# Patient Record
Sex: Female | Born: 1975 | Race: Black or African American | Hispanic: No | Marital: Married | Smoking: Never smoker
Health system: Southern US, Community
[De-identification: ages and names within clinical notes are randomized; demographics above are authoritative.]

## PROBLEM LIST (undated history)

## (undated) DIAGNOSIS — Z789 Other specified health status: Secondary | ICD-10-CM

## (undated) HISTORY — DX: Other specified health status: Z78.9

---

## 2014-12-07 ENCOUNTER — Emergency Department (HOSPITAL_COMMUNITY): Payer: Medicaid Other

## 2014-12-07 ENCOUNTER — Encounter (HOSPITAL_COMMUNITY): Payer: Self-pay | Admitting: Emergency Medicine

## 2014-12-07 ENCOUNTER — Emergency Department (HOSPITAL_COMMUNITY)
Admission: EM | Admit: 2014-12-07 | Discharge: 2014-12-07 | Disposition: A | Discharge status: Home / Self Care | Payer: Medicaid Other | Attending: Emergency Medicine | Admitting: Emergency Medicine

## 2014-12-07 DIAGNOSIS — Z3201 Encounter for pregnancy test, result positive: Secondary | ICD-10-CM | POA: Diagnosis not present

## 2014-12-07 DIAGNOSIS — Z331 Pregnant state, incidental: Secondary | ICD-10-CM | POA: Insufficient documentation

## 2014-12-07 DIAGNOSIS — R42 Dizziness and giddiness: Secondary | ICD-10-CM | POA: Insufficient documentation

## 2014-12-07 DIAGNOSIS — Z3490 Encounter for supervision of normal pregnancy, unspecified, unspecified trimester: Secondary | ICD-10-CM

## 2014-12-07 LAB — I-STAT CHEM 8, ED
BUN: 4 mg/dL — ABNORMAL LOW (ref 6–20)
Calcium, Ion: 1.19 mmol/L (ref 1.12–1.23)
Chloride: 102 mmol/L (ref 101–111)
Creatinine, Ser: 0.4 mg/dL — ABNORMAL LOW (ref 0.44–1.00)
GLUCOSE: 65 mg/dL (ref 65–99)
HCT: 39 % (ref 36.0–46.0)
Hemoglobin: 13.3 g/dL (ref 12.0–15.0)
Potassium: 3.4 mmol/L — ABNORMAL LOW (ref 3.5–5.1)
Sodium: 139 mmol/L (ref 135–145)
TCO2: 22 mmol/L (ref 0–100)

## 2014-12-07 LAB — HCG, QUANTITATIVE, PREGNANCY: HCG, BETA CHAIN, QUANT, S: 115375 m[IU]/mL — AB (ref <5)

## 2014-12-07 LAB — POC URINE PREG, ED: Preg Test, Ur: POSITIVE — AB

## 2014-12-07 MED ORDER — PRENATAL COMPLETE 14-0.4 MG PO TABS: 1.0000 | ORAL_TABLET | Freq: Every day | ORAL | Status: DC

## 2014-12-07 MED ORDER — ONDANSETRON 4 MG PO TBDP
4.0000 mg | ORAL_TABLET | Freq: Once | ORAL | Status: AC | PRN
  Administered 2014-12-07: 4 mg via ORAL

## 2014-12-07 MED ORDER — ONDANSETRON 4 MG PO TBDP
ORAL_TABLET | ORAL | Status: AC
  Filled 2014-12-07: qty 1

## 2014-12-07 NOTE — Discharge Instructions (Signed)
First Trimester of Pregnancy The first trimester of pregnancy is from week 1 until the end of week 12 (months 1 through 3). A week after a sperm fertilizes an egg, the egg will implant on the wall of the uterus. This embryo will begin to develop into a baby. Genes from you and your partner are forming the baby. The female genes determine whether the baby is a boy or a girl. At 6-8 weeks, the eyes and face are formed, and the heartbeat can be seen on ultrasound. At the end of 12 weeks, all the baby's organs are formed.  Now that you are pregnant, you will want to do everything you can to have a healthy baby. Two of the most important things are to get good prenatal care and to follow your health care provider's instructions. Prenatal care is all the medical care you receive before the baby's birth. This care will help prevent, find, and treat any problems during the pregnancy and childbirth. BODY CHANGES Your body goes through many changes during pregnancy. The changes vary from woman to woman.   You may gain or lose a couple of pounds at first.  You may feel sick to your stomach (nauseous) and throw up (vomit). If the vomiting is uncontrollable, call your health care provider.  You may tire easily.  You may develop headaches that can be relieved by medicines approved by your health care provider.  You may urinate more often. Painful urination may mean you have a bladder infection.  You may develop heartburn as a result of your pregnancy.  You may develop constipation because certain hormones are causing the muscles that push waste through your intestines to slow down.  You may develop hemorrhoids or swollen, bulging veins (varicose veins).  Your breasts may begin to grow larger and become tender. Your nipples may stick out more, and the tissue that surrounds them (areola) may become darker.  Your gums may bleed and may be sensitive to brushing and flossing.  Dark spots or blotches (chloasma,  mask of pregnancy) may develop on your face. This will likely fade after the baby is born.  Your menstrual periods will stop.  You may have a loss of appetite.  You may develop cravings for certain kinds of food.  You may have changes in your emotions from day to day, such as being excited to be pregnant or being concerned that something may go wrong with the pregnancy and baby.  You may have more vivid and strange dreams.  You may have changes in your hair. These can include thickening of your hair, rapid growth, and changes in texture. Some women also have hair loss during or after pregnancy, or hair that feels dry or thin. Your hair will most likely return to normal after your baby is born. WHAT TO EXPECT AT YOUR PRENATAL VISITS During a routine prenatal visit:  You will be weighed to make sure you and the baby are growing normally.  Your blood pressure will be taken.  Your abdomen will be measured to track your baby's growth.  The fetal heartbeat will be listened to starting around week 10 or 12 of your pregnancy.  Test results from any previous visits will be discussed. Your health care provider may ask you:  How you are feeling.  If you are feeling the baby move.  If you have had any abnormal symptoms, such as leaking fluid, bleeding, severe headaches, or abdominal cramping.  If you have any questions. Other tests   that may be performed during your first trimester include:  Blood tests to find your blood type and to check for the presence of any previous infections. They will also be used to check for low iron levels (anemia) and Rh antibodies. Later in the pregnancy, blood tests for diabetes will be done along with other tests if problems develop.  Urine tests to check for infections, diabetes, or protein in the urine.  An ultrasound to confirm the proper growth and development of the baby.  An amniocentesis to check for possible genetic problems.  Fetal screens for  spina bifida and Down syndrome.  You may need other tests to make sure you and the baby are doing well. HOME CARE INSTRUCTIONS  Medicines  Follow your health care provider's instructions regarding medicine use. Specific medicines may be either safe or unsafe to take during pregnancy.  Take your prenatal vitamins as directed.  If you develop constipation, try taking a stool softener if your health care provider approves. Diet  Eat regular, well-balanced meals. Choose a variety of foods, such as meat or vegetable-based protein, fish, milk and low-fat dairy products, vegetables, fruits, and whole grain breads and cereals. Your health care provider will help you determine the amount of weight gain that is right for you.  Avoid raw meat and uncooked cheese. These carry germs that can cause birth defects in the baby.  Eating four or five small meals rather than three large meals a day may help relieve nausea and vomiting. If you start to feel nauseous, eating a few soda crackers can be helpful. Drinking liquids between meals instead of during meals also seems to help nausea and vomiting.  If you develop constipation, eat more high-fiber foods, such as fresh vegetables or fruit and whole grains. Drink enough fluids to keep your urine clear or pale yellow. Activity and Exercise  Exercise only as directed by your health care provider. Exercising will help you:  Control your weight.  Stay in shape.  Be prepared for labor and delivery.  Experiencing pain or cramping in the lower abdomen or low back is a good sign that you should stop exercising. Check with your health care provider before continuing normal exercises.  Try to avoid standing for long periods of time. Move your legs often if you must stand in one place for a long time.  Avoid heavy lifting.  Wear low-heeled shoes, and practice good posture.  You may continue to have sex unless your health care provider directs you  otherwise. Relief of Pain or Discomfort  Wear a good support bra for breast tenderness.   Take warm sitz baths to soothe any pain or discomfort caused by hemorrhoids. Use hemorrhoid cream if your health care provider approves.   Rest with your legs elevated if you have leg cramps or low back pain.  If you develop varicose veins in your legs, wear support hose. Elevate your feet for 15 minutes, 3-4 times a day. Limit salt in your diet. Prenatal Care  Schedule your prenatal visits by the twelfth week of pregnancy. They are usually scheduled monthly at first, then more often in the last 2 months before delivery.  Write down your questions. Take them to your prenatal visits.  Keep all your prenatal visits as directed by your health care provider. Safety  Wear your seat belt at all times when driving.  Make a list of emergency phone numbers, including numbers for family, friends, the hospital, and police and fire departments. General Tips    Ask your health care provider for a referral to a local prenatal education class. Begin classes no later than at the beginning of month 6 of your pregnancy.  Ask for help if you have counseling or nutritional needs during pregnancy. Your health care provider can offer advice or refer you to specialists for help with various needs.  Do not use hot tubs, steam rooms, or saunas.  Do not douche or use tampons or scented sanitary pads.  Do not cross your legs for long periods of time.  Avoid cat litter boxes and soil used by cats. These carry germs that can cause birth defects in the baby and possibly loss of the fetus by miscarriage or stillbirth.  Avoid all smoking, herbs, alcohol, and medicines not prescribed by your health care provider. Chemicals in these affect the formation and growth of the baby.  Schedule a dentist appointment. At home, brush your teeth with a soft toothbrush and be gentle when you floss. SEEK MEDICAL CARE IF:   You have  dizziness.  You have mild pelvic cramps, pelvic pressure, or nagging pain in the abdominal area.  You have persistent nausea, vomiting, or diarrhea.  You have a bad smelling vaginal discharge.  You have pain with urination.  You notice increased swelling in your face, hands, legs, or ankles. SEEK IMMEDIATE MEDICAL CARE IF:   You have a fever.  You are leaking fluid from your vagina.  You have spotting or bleeding from your vagina.  You have severe abdominal cramping or pain.  You have rapid weight gain or loss.  You vomit blood or material that looks like coffee grounds.  You are exposed to [place] measles and have never had them.  You are exposed to fifth disease or chickenpox.  You develop a severe headache.  You have shortness of breath.  You have any kind of trauma, such as from a fall or a car accident. Document Released: 04/10/2001 Document Revised: 08/31/2013 Document Reviewed: 02/24/2013 [employer] Patient Information 2015 [employer]. This information is not intended to replace advice given to you by your health care provider. Make sure you discuss any questions you have with your health care provider.  

## 2014-12-07 NOTE — ED Notes (Signed)
Pt sent home with all belongings

## 2014-12-07 NOTE — ED Provider Notes (Signed)
CSN: [id number]     Arrival date & time 12/07/14  1238 History   First MD Initiated Contact with Patient 12/07/14 1411     Chief Complaint  Patient presents with  . Dizziness     (Consider location/radiation/quality/duration/timing/severity/associated sxs/prior Treatment) Patient is a 39 y.o. female presenting with dizziness. The history is provided by the patient and the spouse. The history is limited by a language barrier. Language interpreter used: husband speaks english.  Dizziness Quality:  Lightheadedness Severity:  Moderate Onset quality:  Gradual Duration:  5 days Timing:  Intermittent Progression:  Unchanged Chronicity:  New Context: bending over and standing up   Associated symptoms: no vomiting   Associated symptoms comment:  Nausea for the last 3 days.  LMP was 10/26/2014.  No vaginal bleeding, dysuria or abd pain.  No vomiting Risk factors: no multiple medications and no new medications     History reviewed. No pertinent past medical history. No past surgical history on file. History reviewed. No pertinent family history. History  Substance Use Topics  . Smoking status: Not on file  . Smokeless tobacco: Not on file  . Alcohol Use: Not on file   OB History    No data available     Review of Systems  Gastrointestinal: Negative for vomiting.  Neurological: Positive for dizziness.  All other systems reviewed and are negative.     Allergies  Review of patient's allergies indicates no known allergies.  Home Medications   Prior to Admission medications   Not on File   BP 109/60 mmHg  Pulse 69  Temp(Src) 98.4 F (36.9 C) (Oral)  Resp 16  SpO2 100% Physical Exam  Constitutional: She is oriented to person, place, and time. She appears well-developed and well-nourished. No distress.  HENT:  Head: Normocephalic and atraumatic.  Mouth/Throat: Oropharynx is clear and moist.  Eyes: Conjunctivae and EOM are normal. Pupils are equal, round, and reactive to  light.  Neck: Normal range of motion. Neck supple.  Cardiovascular: Normal rate, regular rhythm and intact distal pulses.   No murmur heard. Pulmonary/Chest: Effort normal and breath sounds normal. No respiratory distress. She has no wheezes. She has no rales.  Abdominal: Soft. She exhibits no distension. There is no tenderness. There is no rebound and no guarding.  Musculoskeletal: Normal range of motion. She exhibits no edema or tenderness.  Neurological: She is alert and oriented to person, place, and time.  Skin: Skin is warm and dry. No rash noted. No erythema.  Psychiatric: She has a normal mood and affect. Her behavior is normal.  Nursing note and vitals reviewed.   ED Course  Procedures (including critical care time) Labs Review Labs Reviewed  POC URINE PREG, ED - Abnormal; Notable for the following:    Preg Test, Ur POSITIVE (*)    All other components within normal limits  HCG, QUANTITATIVE, PREGNANCY    Imaging Review No results found.   EKG Interpretation None      MDM   Final diagnoses:  None    Patient is a 39 year old G4P3 who is otherwise healthy female who presents with feeling dizzy upon standing for the last 5 days and nausea. No diarrhea, no fever, no vaginal bleeding. Last menstrual period was at the end of June.  Vital signs are within normal limits. She denies any abdominal pain and on exam has a normal exam. Bedside ultrasound abdominally shows a gestational sac measuring 6-7 weeks but no evidence of yolk sac or fetus. Pregnancy test  is positive.  HCG and formal ultrasound transvaginally pending  Pt checked out toDr. Herbie Baltimore, MD 12/13/14 [phone]

## 2014-12-07 NOTE — ED Notes (Signed)
Dizziness x5 days. Emesis x3 days. NO diarrhea. Travelled from [place] 1 week ago. NO fever. NAD. NO other sick contacts at home

## 2014-12-07 NOTE — ED Notes (Signed)
Patient transported to Ultrasound 

## 2014-12-07 NOTE — ED Provider Notes (Signed)
Care assumed from Dr. Anitra Lauth.  Patient with lightheadedness and +HCG.  [place] pending.  Ultrasound shows intrauterine gestational pregnancy at 6 weeks. No complicating features. Start prenatal vitamins.  Followup with OB. Return precautions discussed.  Glynn Octave, MD 12/07/14 1750

## 2014-12-07 NOTE — ED Notes (Signed)
MD at bedside to perform bedside US; more testing ordered. Pt does not speak English- Arabic. Husband at bedside does speak [place].

## 2014-12-31 ENCOUNTER — Ambulatory Visit (INDEPENDENT_AMBULATORY_CARE_PROVIDER_SITE_OTHER): Payer: Medicaid Other | Admitting: Physician Assistant

## 2014-12-31 ENCOUNTER — Other Ambulatory Visit (HOSPITAL_COMMUNITY)
Admission: RE | Admit: 2014-12-31 | Discharge: 2014-12-31 | Disposition: A | Discharge status: Home / Self Care | Payer: Medicaid Other | Source: Ambulatory Visit | Attending: Physician Assistant | Admitting: Physician Assistant

## 2014-12-31 ENCOUNTER — Encounter: Payer: Self-pay | Admitting: Student

## 2014-12-31 ENCOUNTER — Other Ambulatory Visit: Payer: Self-pay | Admitting: Physician Assistant

## 2014-12-31 VITALS — BP 102/62 | HR 71 | Temp 98.2°F | Ht 62.0 in | Wt 106.1 lb

## 2014-12-31 DIAGNOSIS — Z1151 Encounter for screening for human papillomavirus (HPV): Secondary | ICD-10-CM | POA: Insufficient documentation

## 2014-12-31 DIAGNOSIS — Z01419 Encounter for gynecological examination (general) (routine) without abnormal findings: Secondary | ICD-10-CM | POA: Diagnosis present

## 2014-12-31 DIAGNOSIS — O09521 Supervision of elderly multigravida, first trimester: Secondary | ICD-10-CM | POA: Diagnosis present

## 2014-12-31 DIAGNOSIS — Z3A19 19 weeks gestation of pregnancy: Secondary | ICD-10-CM

## 2014-12-31 DIAGNOSIS — Z113 Encounter for screening for infections with a predominantly sexual mode of transmission: Secondary | ICD-10-CM | POA: Insufficient documentation

## 2014-12-31 DIAGNOSIS — Z3689 Encounter for other specified antenatal screening: Secondary | ICD-10-CM

## 2014-12-31 DIAGNOSIS — Z98891 History of uterine scar from previous surgery: Secondary | ICD-10-CM | POA: Insufficient documentation

## 2014-12-31 DIAGNOSIS — Z3481 Encounter for supervision of other normal pregnancy, first trimester: Secondary | ICD-10-CM

## 2014-12-31 DIAGNOSIS — Z23 Encounter for immunization: Secondary | ICD-10-CM

## 2014-12-31 DIAGNOSIS — O09529 Supervision of elderly multigravida, unspecified trimester: Secondary | ICD-10-CM

## 2014-12-31 DIAGNOSIS — Z603 Acculturation difficulty: Secondary | ICD-10-CM | POA: Insufficient documentation

## 2014-12-31 DIAGNOSIS — Z118 Encounter for screening for other infectious and parasitic diseases: Secondary | ICD-10-CM | POA: Diagnosis not present

## 2014-12-31 DIAGNOSIS — O219 Vomiting of pregnancy, unspecified: Secondary | ICD-10-CM | POA: Diagnosis not present

## 2014-12-31 DIAGNOSIS — K117 Disturbances of salivary secretion: Secondary | ICD-10-CM

## 2014-12-31 DIAGNOSIS — Z3491 Encounter for supervision of normal pregnancy, unspecified, first trimester: Secondary | ICD-10-CM | POA: Insufficient documentation

## 2014-12-31 DIAGNOSIS — Z124 Encounter for screening for malignant neoplasm of cervix: Secondary | ICD-10-CM

## 2014-12-31 DIAGNOSIS — O09522 Supervision of elderly multigravida, second trimester: Secondary | ICD-10-CM

## 2014-12-31 DIAGNOSIS — Z789 Other specified health status: Secondary | ICD-10-CM | POA: Insufficient documentation

## 2014-12-31 LAB — POCT URINALYSIS DIP (DEVICE)
Bilirubin Urine: NEGATIVE
Glucose, UA: NEGATIVE mg/dL
Ketones, ur: NEGATIVE mg/dL
LEUKOCYTES UA: NEGATIVE
Nitrite: NEGATIVE
Protein, ur: NEGATIVE mg/dL
SPECIFIC GRAVITY, URINE: 1.02 (ref 1.005–1.030)
Urobilinogen, UA: 1 mg/dL (ref 0.0–1.0)
pH: 8.5 — ABNORMAL HIGH (ref 5.0–8.0)

## 2014-12-31 MED ORDER — PROMETHAZINE HCL 25 MG PO TABS: 25.0000 mg | ORAL_TABLET | Freq: Four times a day (QID) | ORAL | Status: DC | PRN

## 2014-12-31 MED ORDER — GLYCOPYRROLATE 1 MG PO TABS: 1.0000 mg | ORAL_TABLET | Freq: Three times a day (TID) | ORAL | Status: DC

## 2014-12-31 NOTE — Progress Notes (Signed)
Here for first prenatal visit.  Used [employer] [phone]. C/o pytalism, morning sickness, and dizzy at times. Given new prenatal information. C/o some bleeding in early pregnancy- none since 12/07/14 ER visit.

## 2014-12-31 NOTE — Progress Notes (Signed)
  Subjective:    Nancy Wright is being seen today for her first obstetrical visit. She is at [redacted]w[redacted]d gestation. Her obstetrical history is significant for previous c/section for breech presentation. Relationship with FOB: spouse. Patient does intend to breast feed. Pregnancy history fully reviewed.  Patient reports excessive saliva & spitting, and n/v.    Pt speak Arabic; language line used for visit.   Review of Systems:   Review of Systems  HEENT: spitting Cardio: no cheat pain or palpitations Resp: no SOB or cough GI: n/v. No abdominal pain, diarrhea, constipation GU: no vaginal bleeding, vaginal discharge, or vaginal irritation. Denies dysuria.   Objective:     BP 102/62 mmHg  Pulse 71  Temp(Src) 98.2 F (36.8 C)  Ht  (1.575 m)  Wt 106 lb 1.6 oz (48.127 kg)  BMI 19.40 kg/m2 Physical Exam  Exam General: alert & oriented. No apparent distress Neck: Trachea midline, thyroid normal. Full rom. Neck supple Chest: Lungs clear bilaterally. Heart rate RRR, no murmur. Breast exam: no lumps or masses, no nipple discharge Abdomen: bowel sounds present x 4. No distension or tenderness.  Genital: Normal external genitalia. Cervix: no friability, no discharge. Bimanual exam: cervix closed, uterus appropriate size for gestation, no ovarian masses or tenderness.    Assessment:    Pregnancy: [id number] Patient Active Problem List   Diagnosis Date Noted  . High-risk pregnancy, multigravida of advanced maternal age, antepartum 12/31/2014       Plan:     Initial labs drawn. Prenatal vitamins. Flu vaccine given.  Rx for Robinul & promethazine.  Problem list reviewed and updated. Role of ultrasound in pregnancy discussed; fetal survey: ordered. Follow up in 4 weeks.   Judeth Horn, NP  12/31/2014

## 2014-12-31 NOTE — Patient Instructions (Signed)
First Trimester of Pregnancy The first trimester of pregnancy is from week 1 until the end of week 12 (months 1 through 3). During this time, your baby will begin to develop inside you. At 6-8 weeks, the eyes and face are formed, and the heartbeat can be seen on ultrasound. At the end of 12 weeks, all the baby's organs are formed. Prenatal care is all the medical care you receive before the birth of your baby. Make sure you get good prenatal care and follow all of your doctor's instructions. HOME CARE  Medicines  Take medicine only as told by your doctor. Some medicines are safe and some are not during pregnancy.  Take your prenatal vitamins as told by your doctor.  Take medicine that helps you poop (stool softener) as needed if your doctor says it is okay. Diet  Eat regular, healthy meals.  Your doctor will tell you the amount of weight gain that is right for you.  Avoid raw meat and uncooked cheese.  If you feel sick to your stomach (nauseous) or throw up (vomit):  Eat 4 or 5 small meals a day instead of 3 large meals.  Try eating a few soda crackers.  Drink liquids between meals instead of during meals.  If you have a hard time pooping (constipation):  Eat high-fiber foods like fresh vegetables, fruit, and whole grains.  Drink enough fluids to keep your pee (urine) clear or pale yellow. Activity and Exercise  Exercise only as told by your doctor. Stop exercising if you have cramps or pain in your lower belly (abdomen) or low back.  Try to avoid standing for long periods of time. Move your legs often if you must stand in one place for a long time.  Avoid heavy lifting.  Wear low-heeled shoes. Sit and stand up straight.  You can have sex unless your doctor tells you not to. Relief of Pain or Discomfort  Wear a good support bra if your breasts are sore.  Take warm water baths (sitz baths) to soothe pain or discomfort caused by hemorrhoids. Use hemorrhoid cream if your  doctor says it is okay.  Rest with your legs raised if you have leg cramps or low back pain.  Wear support hose if you have puffy, bulging veins (varicose veins) in your legs. Raise (elevate) your feet for 15 minutes, 3-4 times a day. Limit salt in your diet. Prenatal Care  Schedule your prenatal visits by the twelfth week of pregnancy.  Write down your questions. Take them to your prenatal visits.  Keep all your prenatal visits as told by your doctor. Safety  Wear your seat belt at all times when driving.  Make a list of emergency phone numbers. The list should include numbers for family, friends, the hospital, and police and fire departments. General Tips  Ask your doctor for a referral to a local prenatal class. Begin classes no later than at the start of month 6 of your pregnancy.  Ask for help if you need counseling or help with nutrition. Your doctor can give you advice or tell you where to go for help.  Do not use hot tubs, steam rooms, or saunas.  Do not douche or use tampons or scented sanitary pads.  Do not cross your legs for long periods of time.  Avoid litter boxes and soil used by cats.  Avoid all smoking, herbs, and alcohol. Avoid drugs not approved by your doctor.  Visit your dentist. At home, brush your teeth   with a soft toothbrush. Be gentle when you floss. GET HELP IF:  You are dizzy.  You have mild cramps or pressure in your lower belly.  You have a nagging pain in your belly area.  You continue to feel sick to your stomach, throw up, or have watery poop (diarrhea).  You have a bad smelling fluid coming from your vagina.  You have pain with peeing (urination).  You have increased puffiness (swelling) in your face, hands, legs, or ankles. GET HELP RIGHT AWAY IF:   You have a fever.  You are leaking fluid from your vagina.  You have spotting or bleeding from your vagina.  You have very bad belly cramping or pain.  You gain or lose weight  rapidly.  You throw up blood. It may look like coffee grounds.  You are around people who have [place] measles, fifth disease, or chickenpox.  You have a very bad headache.  You have shortness of breath.  You have any kind of trauma, such as from a fall or a car accident. Document Released: 10/03/2007 Document Revised: 08/31/2013 Document Reviewed: 02/24/2013 [facility] Patient Information 2015 [place], [place]. This information is not intended to replace advice given to you by your health care provider. Make sure you discuss any questions you have with your health care provider.   Morning Sickness Morning sickness is when you feel sick to your stomach (nauseous) during pregnancy. This nauseous feeling may or may not come with vomiting. It often occurs in the morning but can be a problem any time of day. Morning sickness is most common during the first trimester, but it may continue throughout pregnancy. While morning sickness is unpleasant, it is usually harmless unless you develop severe and continual vomiting (hyperemesis gravidarum). This condition requires more intense treatment.  CAUSES  The cause of morning sickness is not completely known but seems to be related to normal hormonal changes that occur in pregnancy. RISK FACTORS You are at greater risk if you:  Experienced nausea or vomiting before your pregnancy.  Had morning sickness during a previous pregnancy.  Are pregnant with more than one baby, such as twins. TREATMENT  Do not use any medicines (prescription, over-the-counter, or herbal) for morning sickness without first talking to your health care provider. Your health care provider may prescribe or recommend:  Vitamin B6 supplements.  Anti-nausea medicines.  The herbal medicine ginger. HOME CARE INSTRUCTIONS   Only take over-the-counter or prescription medicines as directed by your health care provider.  Taking multivitamins before getting pregnant can prevent or  decrease the severity of morning sickness in most women.  Eat a piece of dry toast or unsalted crackers before getting out of bed in the morning.  Eat five or six small meals a day.  Eat dry and bland foods (rice, baked potato). Foods high in carbohydrates are often helpful.  Do not drink liquids with your meals. Drink liquids between meals.  Avoid greasy, fatty, and spicy foods.  Get someone to cook for you if the smell of any food causes nausea and vomiting.  If you feel nauseous after taking prenatal vitamins, take the vitamins at night or with a snack.  Snack on protein foods (nuts, yogurt, cheese) between meals if you are hungry.  Eat unsweetened gelatins for desserts.  Wearing an acupressure wristband (worn for sea sickness) may be helpful.  Acupuncture may be helpful.  Do not smoke.  Get a humidifier to keep the air in your house free of odors.  Get plenty of fresh air. SEEK MEDICAL CARE IF:   Your home remedies are not working, and you need medicine.  You feel dizzy or lightheaded.  You are losing weight. SEEK IMMEDIATE MEDICAL CARE IF:   You have persistent and uncontrolled nausea and vomiting.  You pass out (faint). MAKE SURE YOU:  Understand these instructions.  Will watch your condition.  Will get help right away if you are not doing well or get worse. Document Released: 06/07/2006 Document Revised: 04/21/2013 Document Reviewed: 10/01/2012 [facility] Patient Information 2015 [place], [place]. This information is not intended to replace advice given to you by your health care provider. Make sure you discuss any questions you have with your health care provider.

## 2015-01-01 LAB — PRESCRIPTION MONITORING PROFILE (19 PANEL)
Amphetamine/Meth: NEGATIVE ng/mL
BUPRENORPHINE, URINE: NEGATIVE ng/mL
Barbiturate Screen, Urine: NEGATIVE ng/mL
Benzodiazepine Screen, Urine: NEGATIVE ng/mL
Cannabinoid Scrn, Ur: NEGATIVE ng/mL
Carisoprodol, Urine: NEGATIVE ng/mL
Cocaine Metabolites: NEGATIVE ng/mL
Creatinine, Urine: 162.37 mg/dL (ref >20.0)
ECSTASY: NEGATIVE ng/mL
FENTANYL URINE: NEGATIVE ng/mL
MEPERIDINE UR: NEGATIVE ng/mL
METHADONE SCREEN, URINE: NEGATIVE ng/mL
METHAQUALONE SCREEN (URINE): NEGATIVE ng/mL
Nitrites, Initial: NEGATIVE ug/mL
OXYCODONE SCRN UR: NEGATIVE ng/mL
Opiate Screen, Urine: NEGATIVE ng/mL
PROPOXYPHENE: NEGATIVE ng/mL
Phencyclidine, Ur: NEGATIVE ng/mL
Tapentadol, urine: NEGATIVE ng/mL
Tramadol Scrn, Ur: NEGATIVE ng/mL
Zolpidem, Urine: NEGATIVE ng/mL
pH, Initial: 7.9 pH (ref 4.5–8.9)

## 2015-01-01 LAB — CULTURE, OB URINE
COLONY COUNT: NO GROWTH
Organism ID, Bacteria: NO GROWTH

## 2015-01-04 LAB — PRENATAL PROFILE (SOLSTAS)
Antibody Screen: NEGATIVE
Basophils Absolute: 0 10*3/uL (ref 0.0–0.1)
Basophils Relative: 0 % (ref 0–1)
EOS PCT: 2 % (ref 0–5)
Eosinophils Absolute: 0.1 10*3/uL (ref 0.0–0.7)
HCT: 36.6 % (ref 36.0–46.0)
HIV: NONREACTIVE
Hemoglobin: 12.5 g/dL (ref 12.0–15.0)
Hepatitis B Surface Ag: NEGATIVE
LYMPHS ABS: 1.5 10*3/uL (ref 0.7–4.0)
Lymphocytes Relative: 33 % (ref 12–46)
MCH: 27.1 pg (ref 26.0–34.0)
MCHC: 34.2 g/dL (ref 30.0–36.0)
MCV: 79.4 fL (ref 78.0–100.0)
MPV: 10.5 fL (ref 8.6–12.4)
Monocytes Absolute: 0.4 10*3/uL (ref 0.1–1.0)
Monocytes Relative: 9 % (ref 3–12)
Neutro Abs: 2.5 10*3/uL (ref 1.7–7.7)
Neutrophils Relative %: 56 % (ref 43–77)
PLATELETS: 254 10*3/uL (ref 150–400)
RBC: 4.61 MIL/uL (ref 3.87–5.11)
RDW: 14.3 % (ref 11.5–15.5)
RH TYPE: POSITIVE
Rubella: 2.67 Index — ABNORMAL HIGH (ref <0.90)
WBC: 4.5 10*3/uL (ref 4.0–10.5)

## 2015-01-05 LAB — HEMOGLOBINOPATHY EVALUATION
Hemoglobin Other: 0 %
Hgb A2 Quant: 2.4 % (ref 2.2–3.2)
Hgb A: 97.6 % (ref 96.8–97.8)
Hgb F Quant: 0 % (ref 0.0–2.0)
Hgb S Quant: 0 %

## 2015-01-06 LAB — CYTOLOGY - PAP

## 2015-01-17 ENCOUNTER — Telehealth: Payer: Self-pay | Admitting: General Practice

## 2015-01-17 DIAGNOSIS — O09521 Supervision of elderly multigravida, first trimester: Secondary | ICD-10-CM

## 2015-01-17 NOTE — Telephone Encounter (Signed)
Called patient to inquire if she would like a first trimester screen given her risk factor of AMA. Called patient with pacific interpreter [phone]. Patient states she would like screen done. Appt scheduled for 9/30 @ 11am after appt in clinic. Informed patient of appt. Patient verbalized understanding and had no questions

## 2015-01-28 ENCOUNTER — Ambulatory Visit (HOSPITAL_COMMUNITY): Admission: RE | Admit: 2015-01-28 | Payer: Medicaid Other | Source: Ambulatory Visit

## 2015-01-28 ENCOUNTER — Other Ambulatory Visit: Payer: Self-pay | Admitting: General Practice

## 2015-01-28 ENCOUNTER — Ambulatory Visit (HOSPITAL_COMMUNITY)
Admission: RE | Admit: 2015-01-28 | Discharge: 2015-01-28 | Disposition: A | Discharge status: Home / Self Care | Payer: Medicaid Other | Source: Ambulatory Visit | Attending: Family Medicine | Admitting: Family Medicine

## 2015-01-28 ENCOUNTER — Ambulatory Visit (INDEPENDENT_AMBULATORY_CARE_PROVIDER_SITE_OTHER): Payer: Self-pay | Admitting: Family Medicine

## 2015-01-28 VITALS — BP 103/59 | HR 90 | Wt 109.4 lb

## 2015-01-28 DIAGNOSIS — O3421 Maternal care for scar from previous cesarean delivery: Secondary | ICD-10-CM | POA: Insufficient documentation

## 2015-01-28 DIAGNOSIS — Z3A13 13 weeks gestation of pregnancy: Secondary | ICD-10-CM | POA: Diagnosis not present

## 2015-01-28 DIAGNOSIS — K117 Disturbances of salivary secretion: Secondary | ICD-10-CM

## 2015-01-28 DIAGNOSIS — O09521 Supervision of elderly multigravida, first trimester: Secondary | ICD-10-CM | POA: Insufficient documentation

## 2015-01-28 DIAGNOSIS — Z369 Encounter for antenatal screening, unspecified: Secondary | ICD-10-CM

## 2015-01-28 DIAGNOSIS — O34219 Maternal care for unspecified type scar from previous cesarean delivery: Secondary | ICD-10-CM

## 2015-01-28 DIAGNOSIS — Z36 Encounter for antenatal screening of mother: Secondary | ICD-10-CM | POA: Insufficient documentation

## 2015-01-28 DIAGNOSIS — O09529 Supervision of elderly multigravida, unspecified trimester: Secondary | ICD-10-CM

## 2015-01-28 MED ORDER — GLYCOPYRROLATE 1 MG PO TABS
1.0000 mg | ORAL_TABLET | Freq: Three times a day (TID) | ORAL | Status: DC
Start: 2015-01-28 — End: 2015-03-23

## 2015-01-28 NOTE — Progress Notes (Signed)
Nuha used for interpreter Informed patient of pap results and explained hpv

## 2015-01-28 NOTE — Patient Instructions (Signed)
Second Trimester of Pregnancy The second trimester is from week 13 through week 28, months 4 through 6. The second trimester is often a time when you feel your best. Your body has also adjusted to being pregnant, and you begin to feel better physically. Usually, morning sickness has lessened or quit completely, you may have more energy, and you may have an increase in appetite. The second trimester is also a time when the fetus is growing rapidly. At the end of the sixth month, the fetus is about 9 inches long and weighs about 1 pounds. You will likely begin to feel the baby move (quickening) between 18 and 20 weeks of the pregnancy. BODY CHANGES Your body goes through many changes during pregnancy. The changes vary from woman to woman.   Your weight will continue to increase. You will notice your lower abdomen bulging out.  You may begin to get stretch marks on your hips, abdomen, and breasts.  You may develop headaches that can be relieved by medicines approved by your health care provider.  You may urinate more often because the fetus is pressing on your bladder.  You may develop or continue to have heartburn as a result of your pregnancy.  You may develop constipation because certain hormones are causing the muscles that push waste through your intestines to slow down.  You may develop hemorrhoids or swollen, bulging veins (varicose veins).  You may have back pain because of the weight gain and pregnancy hormones relaxing your joints between the bones in your pelvis and as a result of a shift in weight and the muscles that support your balance.  Your breasts will continue to grow and be tender.  Your gums may bleed and may be sensitive to brushing and flossing.  Dark spots or blotches (chloasma, mask of pregnancy) may develop on your face. This will likely fade after the baby is born.  A dark line from your belly button to the pubic area (linea nigra) may appear. This will likely fade  after the baby is born.  You may have changes in your hair. These can include thickening of your hair, rapid growth, and changes in texture. Some women also have hair loss during or after pregnancy, or hair that feels dry or thin. Your hair will most likely return to normal after your baby is born. WHAT TO EXPECT AT YOUR PRENATAL VISITS During a routine prenatal visit:  You will be weighed to make sure you and the fetus are growing normally.  Your blood pressure will be taken.  Your abdomen will be measured to track your baby's growth.  The fetal heartbeat will be listened to.  Any test results from the previous visit will be discussed. Your health care provider may ask you:  How you are feeling.  If you are feeling the baby move.  If you have had any abnormal symptoms, such as leaking fluid, bleeding, severe headaches, or abdominal cramping.  If you have any questions. Other tests that may be performed during your second trimester include:  Blood tests that check for:  Low iron levels (anemia).  Gestational diabetes (between 24 and 28 weeks).  Rh antibodies.  Urine tests to check for infections, diabetes, or protein in the urine.  An ultrasound to confirm the proper growth and development of the baby.  An amniocentesis to check for possible genetic problems.  Fetal screens for spina bifida and Down syndrome. HOME CARE INSTRUCTIONS   Avoid all smoking, herbs, alcohol, and unprescribed   drugs. These chemicals affect the formation and growth of the baby.  Follow your health care provider's instructions regarding medicine use. There are medicines that are either safe or unsafe to take during pregnancy.  Exercise only as directed by your health care provider. Experiencing uterine cramps is a good sign to stop exercising.  Continue to eat regular, healthy meals.  Wear a good support bra for breast tenderness.  Do not use hot tubs, steam rooms, or saunas.  Wear your  seat belt at all times when driving.  Avoid raw meat, uncooked cheese, cat litter boxes, and soil used by cats. These carry germs that can cause birth defects in the baby.  Take your prenatal vitamins.  Try taking a stool softener (if your health care provider approves) if you develop constipation. Eat more high-fiber foods, such as fresh vegetables or fruit and whole grains. Drink plenty of fluids to keep your urine clear or pale yellow.  Take warm sitz baths to soothe any pain or discomfort caused by hemorrhoids. Use hemorrhoid cream if your health care provider approves.  If you develop varicose veins, wear support hose. Elevate your feet for 15 minutes, 3-4 times a day. Limit salt in your diet.  Avoid heavy lifting, wear low heel shoes, and practice good posture.  Rest with your legs elevated if you have leg cramps or low back pain.  Visit your dentist if you have not gone yet during your pregnancy. Use a soft toothbrush to brush your teeth and be gentle when you floss.  A sexual relationship may be continued unless your health care provider directs you otherwise.  Continue to go to all your prenatal visits as directed by your health care provider. SEEK MEDICAL CARE IF:   You have dizziness.  You have mild pelvic cramps, pelvic pressure, or nagging pain in the abdominal area.  You have persistent nausea, vomiting, or diarrhea.  You have a bad smelling vaginal discharge.  You have pain with urination. SEEK IMMEDIATE MEDICAL CARE IF:   You have a fever.  You are leaking fluid from your vagina.  You have spotting or bleeding from your vagina.  You have severe abdominal cramping or pain.  You have rapid weight gain or loss.  You have shortness of breath with chest pain.  You notice sudden or extreme swelling of your face, hands, ankles, feet, or legs.  You have not felt your baby move in over an hour.  You have severe headaches that do not go away with  medicine.  You have vision changes. Document Released: 04/10/2001 Document Revised: 04/21/2013 Document Reviewed: 06/17/2012 Exit[employer] Patient Information 2015 [employer]. This information is not intended to replace advice given to you by your health care provider. Make sure you discuss any questions you have with your health care provider.  

## 2015-01-28 NOTE — Progress Notes (Signed)
Subjective:  Nancy Wright is a 39 y.o. G4P3003 at [redacted]w[redacted]d being seen today for ongoing prenatal care.  Patient reports improved nausea and vomiting with Robinol.  Tolerating foods and liquids..  Contractions: Not present.  Vag. Bleeding: None. Movement: Absent. Denies leaking of fluid.   The following portions of the patient's history were reviewed and updated as appropriate: allergies, current medications, past family history, past medical history, past social history, past surgical history and problem list.   Objective:   Filed Vitals:   01/28/15 0952  BP: 103/59  Pulse: 90  Weight: 109 lb 6.4 oz (49.624 kg)    Fetal Status: Fetal Heart Rate (bpm): 159   Movement: Absent     General:  Alert, oriented and cooperative. Patient is in no acute distress.  Skin: Skin is warm and dry. No rash noted.   Cardiovascular: Normal heart rate noted  Respiratory: Normal respiratory effort, no problems with respiration noted  Abdomen: Soft, gravid, appropriate for gestational age. Pain/Pressure: Present     Pelvic: Vag. Bleeding: None     Cervical exam deferred        Extremities: Normal range of motion.  Edema: None  Mental Status: Normal mood and affect. Normal behavior. Normal judgment and thought content.   Urinalysis:      Assessment and Plan:  Pregnancy: G4P3003 at [redacted]w[redacted]d  1. High-risk pregnancy, multigravida of advanced maternal age, antepartum Refill robinol.  Follow up in 4 weeks.  FHT normal.  Preterm labor symptoms and general obstetric precautions including but not limited to vaginal bleeding, contractions, leaking of fluid and fetal movement were reviewed in detail with the patient. Please refer to After Visit Summary for other counseling recommendations.  No Follow-up on file.   Levie Heritage, DO

## 2015-02-23 ENCOUNTER — Ambulatory Visit (INDEPENDENT_AMBULATORY_CARE_PROVIDER_SITE_OTHER): Payer: Medicaid Other | Admitting: Advanced Practice Midwife

## 2015-02-23 VITALS — BP 100/52 | HR 82 | Temp 98.2°F | Wt 111.7 lb

## 2015-02-23 DIAGNOSIS — O09522 Supervision of elderly multigravida, second trimester: Secondary | ICD-10-CM | POA: Diagnosis not present

## 2015-02-23 DIAGNOSIS — O09529 Supervision of elderly multigravida, unspecified trimester: Secondary | ICD-10-CM

## 2015-02-23 LAB — POCT URINALYSIS DIP (DEVICE)
BILIRUBIN URINE: NEGATIVE
Glucose, UA: NEGATIVE mg/dL
HGB URINE DIPSTICK: NEGATIVE
KETONES UR: NEGATIVE mg/dL
Leukocytes, UA: NEGATIVE
Nitrite: NEGATIVE
Protein, ur: NEGATIVE mg/dL
SPECIFIC GRAVITY, URINE: 1.015 (ref 1.005–1.030)
Urobilinogen, UA: 1 mg/dL (ref 0.0–1.0)
pH: 8.5 — ABNORMAL HIGH (ref 5.0–8.0)

## 2015-02-23 NOTE — Patient Instructions (Signed)

## 2015-02-23 NOTE — Progress Notes (Signed)
Subjective:  Nancy Wright is a 39 y.o. G4P3003 at 3864w1d being seen today for ongoing prenatal care.  Patient reports no complaints.  Contractions: Not present.  Vag. Bleeding: None. Movement: Present. Denies leaking of fluid.   The following portions of the patient's history were reviewed and updated as appropriate: allergies, current medications, past family history, past medical history, past social history, past surgical history and problem list. Problem list updated.  Objective:   Filed Vitals:   02/23/15 0950  BP: 100/52  Pulse: 82  Temp: 98.2 F (36.8 C)  Weight: 111 lb 11.2 oz (50.667 kg)    Fetal Status: Fetal Heart Rate (bpm): 150   Movement: Present     General:  Alert, oriented and cooperative. Patient is in no acute distress.  Skin: Skin is warm and dry. No rash noted.   Cardiovascular: Normal heart rate noted  Respiratory: Normal respiratory effort, no problems with respiration noted  Abdomen: Soft, gravid, appropriate for gestational age. Pain/Pressure: Present     Pelvic: Vag. Bleeding: None     Cervical exam deferred        Extremities: Normal range of motion.  Edema: None  Mental Status: Normal mood and affect. Normal behavior. Normal judgment and thought content.   Urinalysis: Urine Protein: Negative Urine Glucose: Negative  Assessment and Plan:  Pregnancy: G4P3003 at 2664w1d  1. High-risk pregnancy, multigravida of advanced maternal age, antepartum   2. AMA (advanced maternal age) multigravida 35+, second trimester Declined Quad.  Considering NIPS. Will Discuss at Genetic Counseling appt 11/14  Preterm labor symptoms and general obstetric precautions including but not limited to vaginal bleeding, contractions, leaking of fluid and fetal movement were reviewed in detail with the patient. Please refer to After Visit Summary for other counseling recommendations.  Return in about 4 weeks (around 03/23/2015).   Dorathy KinsmanVirginia Ameila Weldon, CNM

## 2015-02-23 NOTE — Progress Notes (Signed)
Breastfeeding tip of the week reviewed. 

## 2015-03-14 ENCOUNTER — Ambulatory Visit (HOSPITAL_COMMUNITY)
Admission: RE | Admit: 2015-03-14 | Discharge: 2015-03-14 | Disposition: A | Discharge status: Home / Self Care | Payer: Medicaid Other | Source: Ambulatory Visit | Attending: Obstetrics and Gynecology | Admitting: Obstetrics and Gynecology

## 2015-03-14 ENCOUNTER — Ambulatory Visit (HOSPITAL_COMMUNITY): Payer: Self-pay

## 2015-03-14 ENCOUNTER — Ambulatory Visit (HOSPITAL_COMMUNITY)
Admission: RE | Admit: 2015-03-14 | Discharge: 2015-03-14 | Disposition: A | Discharge status: Home / Self Care | Payer: Medicaid Other | Source: Ambulatory Visit | Attending: Physician Assistant | Admitting: Physician Assistant

## 2015-03-14 VITALS — BP 96/59 | HR 86 | Wt 116.0 lb

## 2015-03-14 DIAGNOSIS — Z3A19 19 weeks gestation of pregnancy: Secondary | ICD-10-CM | POA: Diagnosis not present

## 2015-03-14 DIAGNOSIS — O09523 Supervision of elderly multigravida, third trimester: Secondary | ICD-10-CM

## 2015-03-14 DIAGNOSIS — Z36 Encounter for antenatal screening of mother: Secondary | ICD-10-CM | POA: Insufficient documentation

## 2015-03-14 DIAGNOSIS — O09529 Supervision of elderly multigravida, unspecified trimester: Secondary | ICD-10-CM

## 2015-03-14 DIAGNOSIS — Z3689 Encounter for other specified antenatal screening: Secondary | ICD-10-CM

## 2015-03-14 DIAGNOSIS — O09522 Supervision of elderly multigravida, second trimester: Secondary | ICD-10-CM | POA: Insufficient documentation

## 2015-03-21 ENCOUNTER — Telehealth (HOSPITAL_COMMUNITY): Payer: Self-pay | Admitting: MS"

## 2015-03-21 NOTE — Telephone Encounter (Signed)
Called Kiora Gulas to discuss her prenatal cell free DNA test results via Arabic/English interpreter [phone].  Mrs. Kyoko Herrod had Panorama testing through [facility].  Testing was offered because of advanced maternal age.   The patient was identified by name and DOB.  We reviewed that these are within normal limits, showing a less than 1 in 10,000 risk for trisomies 21, 18 and 13, and monosomy X (Turner syndrome).  In addition, the risk for triploidy/vanishing twin and sex chromosome trisomies (47,XXX and 47,XXY) was also low risk. This testing identifies > 99% of pregnancies with trisomy 3021, trisomy 6613, sex chromosome trisomies (47,XXX and 47,XXY), and triploidy. The detection rate for trisomy 18 is 96%.  The detection rate for monosomy X is ~92%.  The false positive rate is <0.1% for all conditions. Testing was also consistent with female fetal sex.  She understands that this testing does not identify all genetic conditions.  All questions were answered to her satisfaction, she was encouraged to call with additional questions or concerns.  Quinn PlowmanKaren Adonys Wildes, MS Patent attorneyCertified Genetic Counselor

## 2015-03-21 NOTE — Progress Notes (Signed)
Genetic Counseling  High-Risk Gestation Note  Appointment Date:  03/14/2015 Referred By: Bertram Denver, * Date of Birth:  1976/01/11   Pregnancy History: [id number] Estimated Date of Delivery: 08/02/15 Estimated Gestational Age: [redacted]w[redacted]d Attending: Alpha Gula, MD   Ms. Nancy Wright was seen for genetic counseling because of a maternal age of 39 y.o..   Arabic/English medical interpreters [phone] ([employer]) and in-person, Nancy Wright, provided interpretation for different parts of today's visit.   In Summary:   Advanced maternal age  Patient elected to have NIPS (Panorama) drawn today  Detailed ultrasound performed today  Family history contributory for very distant consanguinity  Advanced paternal age  Follow-up ultrasound scheduled for 05/23/15  She was counseled regarding maternal age and the association with risk for chromosome conditions due to nondisjunction with aging of the ova.   We reviewed chromosomes, nondisjunction, and the associated 1 in 65 risk for fetal aneuploidy related to a maternal age of 39 y.o. at [redacted]w[redacted]d gestation.  She was counseled that the risk for aneuploidy decreases as gestational age increases, accounting for those pregnancies which spontaneously abort.  We specifically discussed Down syndrome (trisomy 64), trisomies 73 and 22, and sex chromosome aneuploidies (47,XXX and 47,XXY) including the common features and prognoses of each.   We reviewed available screening options including Quad screen, noninvasive prenatal screening (NIPS)/cell free DNA (cfDNA) testing, and detailed ultrasound.  She was counseled that screening tests are used to modify a patient's a priori risk for aneuploidy, typically based on age. This estimate provides a pregnancy specific risk assessment. We reviewed the benefits and limitations of each option. Specifically, we discussed the conditions for which each test screens, the detection rates, and false positive rates of each. She  was also counseled regarding diagnostic testing via amniocentesis. We reviewed the approximate 1 in 300-500 risk for complications for amniocentesis, including spontaneous pregnancy loss. After consideration of all the options, she elected to proceed with NIPS (Panorama).  Those results will be available in 8-10 days.  She declined amniocentesis.   The patient also expressed interest in having a detailed ultrasound.  A complete ultrasound was performed today. The ultrasound report will be sent under separate cover. There were no visualized fetal anomalies or markers suggestive of aneuploidy. Diagnostic testing was declined today.  She understands that screening tests cannot rule out all birth defects or genetic syndromes. The patient was advised of this limitation and states she still does not want additional testing at this time.   Both family histories were reviewed and found to be contributory for consanguinity. The patient reported that she and her husband's great-grandparents were cousins to each other, but she was unable to describe the exact degree of relation. We discussed that children born to a consanguineous couple are at increased risk for genetic health problems.  This increase in risk is related to the possibility of passing on recessive genes. We explained that every person carries approximately 7-10 non-working genes that when received in a double dose results in recessive genetic conditions. However, given the reported degree of relation for the couple, which would be at least third cousins or farther removed, the reported consanguinity would not likely increase the risk for birth defects or genetic conditions above the general population risk. Without further information regarding the provided family history, an accurate genetic risk cannot be calculated. Further genetic counseling is warranted if more information is obtained.  The father of the pregnancy is reportedly 20 years old. Advanced  paternal age (APA)  is defined as paternal age greater than or equal to age 38.  Recent large-scale sequencing studies have shown that approximately 80% of de novo point mutations are of paternal origin.  Many studies have demonstrated a strong correlation between increased paternal age and de novo point mutations.  Although no specific data is available regarding fetal risks for fathers 8845+ years old at conception, it is apparent that the overall risk for single gene conditions is increased.  To estimate the relative increase in risk of a genetic disorder with APA, the heritability of the disease must be considered.  Assuming an approximate 2x increase in risk for conditions that are exclusively paternal in origin, the risk for each individual condition is still relatively low.  It is estimated that the overall chance for a de novo mutation is ~0.5%.  We also discussed the wide range of conditions which can be caused by new dominant gene mutations (achondroplasia, neurofibromatosis, Marfan syndrome etc.).  Genetic testing for each individual single gene condition is not warranted or available unless ultrasound or family history concerns lend suspicion to a specific condition.  In addition, we discussed the recommendation for a detailed ultrasound at 18+ weeks gestation and a follow up ultrasound at ~28 weeks to monitor fetal growth.  Ms. Nancy Wright denied exposure to environmental toxins or chemical agents. She denied the use of alcohol, tobacco or street drugs. She denied significant viral illnesses during the course of her pregnancy. Her medical and surgical histories were noncontributory.   I counseled Ms. Nancy Wright regarding the above risks and available options.  The approximate face-to-face time with the genetic counselor was 45 minutes.  Nancy PlowmanKaren Monroe Toure, MS,  Certified Genetic Counselor 03/21/2015

## 2015-03-23 ENCOUNTER — Other Ambulatory Visit (HOSPITAL_COMMUNITY): Payer: Self-pay

## 2015-03-23 ENCOUNTER — Ambulatory Visit (INDEPENDENT_AMBULATORY_CARE_PROVIDER_SITE_OTHER): Payer: Medicaid Other | Admitting: Advanced Practice Midwife

## 2015-03-23 VITALS — BP 100/62 | HR 88 | Wt 116.2 lb

## 2015-03-23 DIAGNOSIS — O26899 Other specified pregnancy related conditions, unspecified trimester: Secondary | ICD-10-CM

## 2015-03-23 DIAGNOSIS — O26852 Spotting complicating pregnancy, second trimester: Secondary | ICD-10-CM | POA: Diagnosis not present

## 2015-03-23 DIAGNOSIS — R109 Unspecified abdominal pain: Secondary | ICD-10-CM

## 2015-03-23 DIAGNOSIS — O9989 Other specified diseases and conditions complicating pregnancy, childbirth and the puerperium: Secondary | ICD-10-CM

## 2015-03-23 DIAGNOSIS — O09529 Supervision of elderly multigravida, unspecified trimester: Secondary | ICD-10-CM | POA: Diagnosis present

## 2015-03-23 LAB — POCT URINALYSIS DIP (DEVICE)
Bilirubin Urine: NEGATIVE
Glucose, UA: NEGATIVE mg/dL
Hgb urine dipstick: NEGATIVE
Ketones, ur: NEGATIVE mg/dL
Nitrite: NEGATIVE
Protein, ur: NEGATIVE mg/dL
Specific Gravity, Urine: 1.02 (ref 1.005–1.030)
Urobilinogen, UA: 0.2 mg/dL (ref 0.0–1.0)
pH: 7.5 (ref 5.0–8.0)

## 2015-03-23 MED ORDER — PRENATAL COMPLETE 14-0.4 MG PO TABS: 1.0000 | ORAL_TABLET | Freq: Every day | ORAL | Status: DC

## 2015-03-23 NOTE — Progress Notes (Signed)
Patient reports episode of small bleeding last week Sarra used for interpreter  Reviewed tip of week with patient

## 2015-03-23 NOTE — Progress Notes (Signed)
Subjective:  Nancy Wright is a 39 y.o. G4P3003 at 7163w1d being seen today for ongoing prenatal care.  She is currently monitored for the following issues for this high risk pregnancy and has High-risk pregnancy, multigravida of advanced maternal age, antepartum; H/O: cesarean section; Language barrier; and AMA (advanced maternal age) multigravida 35+ on her problem list.  Patient reports mild cramping and spotting on Monday.  Pt walked a long distance and carried her child earlier that day..  Contractions: Not present. Vag. Bleeding: Small.  Movement: Present. Denies leaking of fluid.   The following portions of the patient's history were reviewed and updated as appropriate: allergies, current medications, past family history, past medical history, past social history, past surgical history and problem list. Problem list updated.  Objective:   Filed Vitals:   03/23/15 1002  BP: 100/62  Pulse: 88  Weight: 116 lb 3.2 oz (52.708 kg)    Fetal Status: Fetal Heart Rate (bpm): 160 Fundal Height: 21 cm Movement: Present     General:  Alert, oriented and cooperative. Patient is in no acute distress.  Skin: Skin is warm and dry. No rash noted.   Cardiovascular: Normal heart rate noted  Respiratory: Normal respiratory effort, no problems with respiration noted  Abdomen: Soft, gravid, appropriate for gestational age. Pain/Pressure: Present     Pelvic: Vag. Bleeding: Small     Cervical exam deferred        Extremities: Normal range of motion.  Edema: None  Mental Status: Normal mood and affect. Normal behavior. Normal judgment and thought content.   Urinalysis: Urine Protein: Negative Urine Glucose: Negative  Assessment and Plan:  Pregnancy: G4P3003 at 1463w1d  1. High-risk pregnancy, multigravida of advanced maternal age, antepartum   2. Abdominal pain complicating pregnancy, antepartum - Culture, OB Urine  3. Spotting affecting pregnancy in second trimester, antepartum --Reassurance provided  to pt that spotting likely related to activity level on Monday.  Bleeding precautions/reasons to come to hospital given.  Preterm labor symptoms and general obstetric precautions including but not limited to vaginal bleeding, contractions, leaking of fluid and fetal movement were reviewed in detail with the patient.   Please refer to After Visit Summary for other counseling recommendations.  Return in about 4 weeks (around 04/20/2015).   Hurshel PartyLisa A Leftwich-Kirby, CNM

## 2015-03-25 LAB — CULTURE, OB URINE
COLONY COUNT: NO GROWTH
ORGANISM ID, BACTERIA: NO GROWTH

## 2015-04-27 ENCOUNTER — Ambulatory Visit (INDEPENDENT_AMBULATORY_CARE_PROVIDER_SITE_OTHER): Payer: Medicaid Other | Admitting: Certified Nurse Midwife

## 2015-04-27 VITALS — BP 102/61 | HR 78 | Wt 117.6 lb

## 2015-04-27 DIAGNOSIS — O09522 Supervision of elderly multigravida, second trimester: Secondary | ICD-10-CM

## 2015-04-27 DIAGNOSIS — O34219 Maternal care for unspecified type scar from previous cesarean delivery: Secondary | ICD-10-CM

## 2015-04-27 DIAGNOSIS — O09892 Supervision of other high risk pregnancies, second trimester: Secondary | ICD-10-CM

## 2015-04-27 LAB — POCT URINALYSIS DIP (DEVICE)
Bilirubin Urine: NEGATIVE
Glucose, UA: NEGATIVE mg/dL
Hgb urine dipstick: NEGATIVE
Ketones, ur: NEGATIVE mg/dL
Leukocytes, UA: NEGATIVE
Nitrite: NEGATIVE
Protein, ur: NEGATIVE mg/dL
Specific Gravity, Urine: 1.025 (ref 1.005–1.030)
Urobilinogen, UA: 0.2 mg/dL (ref 0.0–1.0)
pH: 6 (ref 5.0–8.0)

## 2015-04-27 NOTE — Progress Notes (Signed)
Subjective:  Nancy Wright is a 39 y.o. G4P3003 at 739w1d being seen today for ongoing prenatal care.  She is currently monitored for the following issues for this high-risk pregnancy and has High-risk pregnancy, multigravida of advanced maternal age, antepartum; H/O: cesarean section; Language barrier; and AMA (advanced maternal age) multigravida 35+ on her problem list.  Patient reports no complaints.  Contractions: Irregular. Vag. Bleeding: None.  Movement: Present. Denies leaking of fluid.   The following portions of the patient's history were reviewed and updated as appropriate: allergies, current medications, past family history, past medical history, past social history, past surgical history and problem list. Problem list updated.  Objective:   Filed Vitals:   04/27/15 1031  BP: 102/61  Pulse: 78  Weight: 117 lb 9.6 oz (53.343 kg)    Fetal Status:   Fundal Height: 29 cm Movement: Present     General:  Alert, oriented and cooperative. Patient is in no acute distress.  Skin: Skin is warm and dry. No rash noted.   Cardiovascular: Normal heart rate noted  Respiratory: Normal respiratory effort, no problems with respiration noted  Abdomen: Soft, gravid, appropriate for gestational age. Pain/Pressure: Present     Pelvic: Vag. Bleeding: None     Cervical exam deferred        Extremities: Normal range of motion.  Edema: None  Mental Status: Normal mood and affect. Normal behavior. Normal judgment and thought content.   Urinalysis: Urine Protein: Negative Urine Glucose: Negative  Assessment and Plan:  Pregnancy: G4P3003 at 429w1d  There are no diagnoses linked to this encounter. Preterm labor symptoms and general obstetric precautions including but not limited to vaginal bleeding, contractions, leaking of fluid and fetal movement were reviewed in detail with the patient. Please refer to After Visit Summary for other counseling recommendations.  Return in about 2 weeks (around 05/11/2015)  for LOB and 1hr GTT, Tdap.   Rhea PinkLori A Teirra Carapia, CNM

## 2015-04-27 NOTE — Progress Notes (Signed)
Interpreter Daleen BoFeryal Yousef present for encounter.  Pt did not know her Rx for PNV's was sent to pharmacy last month and has not been taking - will obtain today.

## 2015-04-27 NOTE — Patient Instructions (Signed)
Second Trimester of Pregnancy The second trimester is from week 13 through week 28, months 4 through 6. The second trimester is often a time when you feel your best. Your body has also adjusted to being pregnant, and you begin to feel better physically. Usually, morning sickness has lessened or quit completely, you may have more energy, and you may have an increase in appetite. The second trimester is also a time when the fetus is growing rapidly. At the end of the sixth month, the fetus is about 9 inches long and weighs about 1 pounds. You will likely begin to feel the baby move (quickening) between 18 and 20 weeks of the pregnancy. BODY CHANGES Your body goes through many changes during pregnancy. The changes vary from woman to woman.   Your weight will continue to increase. You will notice your lower abdomen bulging out.  You may begin to get stretch marks on your hips, abdomen, and breasts.  You may develop headaches that can be relieved by medicines approved by your health care provider.  You may urinate more often because the fetus is pressing on your bladder.  You may develop or continue to have heartburn as a result of your pregnancy.  You may develop constipation because certain hormones are causing the muscles that push waste through your intestines to slow down.  You may develop hemorrhoids or swollen, bulging veins (varicose veins).  You may have back pain because of the weight gain and pregnancy hormones relaxing your joints between the bones in your pelvis and as a result of a shift in weight and the muscles that support your balance.  Your breasts will continue to grow and be tender.  Your gums may bleed and may be sensitive to brushing and flossing.  Dark spots or blotches (chloasma, mask of pregnancy) may develop on your face. This will likely fade after the baby is born.  A dark line from your belly button to the pubic area (linea nigra) may appear. This will likely fade  after the baby is born.  You may have changes in your hair. These can include thickening of your hair, rapid growth, and changes in texture. Some women also have hair loss during or after pregnancy, or hair that feels dry or thin. Your hair will most likely return to normal after your baby is born. WHAT TO EXPECT AT YOUR PRENATAL VISITS During a routine prenatal visit:  You will be weighed to make sure you and the fetus are growing normally.  Your blood pressure will be taken.  Your abdomen will be measured to track your baby's growth.  The fetal heartbeat will be listened to.  Any test results from the previous visit will be discussed. Your health care provider may ask you:  How you are feeling.  If you are feeling the baby move.  If you have had any abnormal symptoms, such as leaking fluid, bleeding, severe headaches, or abdominal cramping.  If you are using any tobacco products, including cigarettes, chewing tobacco, and electronic cigarettes.  If you have any questions. Other tests that may be performed during your second trimester include:  Blood tests that check for:  Low iron levels (anemia).  Gestational diabetes (between 24 and 28 weeks).  Rh antibodies.  Urine tests to check for infections, diabetes, or protein in the urine.  An ultrasound to confirm the proper growth and development of the baby.  An amniocentesis to check for possible genetic problems.  Fetal screens for spina bifida   and Down syndrome.  HIV (human immunodeficiency virus) testing. Routine prenatal testing includes screening for HIV, unless you choose not to have this test. HOME CARE INSTRUCTIONS   Avoid all smoking, herbs, alcohol, and unprescribed drugs. These chemicals affect the formation and growth of the baby.  Do not use any tobacco products, including cigarettes, chewing tobacco, and electronic cigarettes. If you need help quitting, ask your health care provider. You may receive  counseling support and other resources to help you quit.  Follow your health care provider's instructions regarding medicine use. There are medicines that are either safe or unsafe to take during pregnancy.  Exercise only as directed by your health care provider. Experiencing uterine cramps is a good sign to stop exercising.  Continue to eat regular, healthy meals.  Wear a good support bra for breast tenderness.  Do not use hot tubs, steam rooms, or saunas.  Wear your seat belt at all times when driving.  Avoid raw meat, uncooked cheese, cat litter boxes, and soil used by cats. These carry germs that can cause birth defects in the baby.  Take your prenatal vitamins.  Take 1500-2000 mg of calcium daily starting at the 20th week of pregnancy until you deliver your baby.  Try taking a stool softener (if your health care provider approves) if you develop constipation. Eat more high-fiber foods, such as fresh vegetables or fruit and whole grains. Drink plenty of fluids to keep your urine clear or pale yellow.  Take warm sitz baths to soothe any pain or discomfort caused by hemorrhoids. Use hemorrhoid cream if your health care provider approves.  If you develop varicose veins, wear support hose. Elevate your feet for 15 minutes, 3-4 times a day. Limit salt in your diet.  Avoid heavy lifting, wear low heel shoes, and practice good posture.  Rest with your legs elevated if you have leg cramps or low back pain.  Visit your dentist if you have not gone yet during your pregnancy. Use a soft toothbrush to brush your teeth and be gentle when you floss.  A sexual relationship may be continued unless your health care provider directs you otherwise.  Continue to go to all your prenatal visits as directed by your health care provider. SEEK MEDICAL CARE IF:   You have dizziness.  You have mild pelvic cramps, pelvic pressure, or nagging pain in the abdominal area.  You have persistent nausea,  vomiting, or diarrhea.  You have a bad smelling vaginal discharge.  You have pain with urination. SEEK IMMEDIATE MEDICAL CARE IF:   You have a fever.  You are leaking fluid from your vagina.  You have spotting or bleeding from your vagina.  You have severe abdominal cramping or pain.  You have rapid weight gain or loss.  You have shortness of breath with chest pain.  You notice sudden or extreme swelling of your face, hands, ankles, feet, or legs.  You have not felt your baby move in over an hour.  You have severe headaches that do not go away with medicine.  You have vision changes.   This information is not intended to replace advice given to you by your health care provider. Make sure you discuss any questions you have with your health care provider.   Document Released: 04/10/2001 Document Revised: 05/07/2014 Document Reviewed: 06/17/2012 Elsevier Interactive Patient Education 2016 [employer] Glucose Tolerance Test The glucose tolerance test (GTT) is one of several tests used to diagnose diabetes mellitus. The GTT is a   blood test, and it may include a urine test as well. The GTT checks to see how your body processes sugar (glucose). For this test, you will consume a drink containing a high level of glucose. Your blood glucose levels will be checked before you consume the drink and then again 1, 2, 3, and possibly 4 hours after you consume it. Your health care provider may recommend that you have the GTT if you:  Have a family history of diabetes.   Are very overweight (obese).   Have experienced infections that keep coming back.   Have had numerous cuts or wounds that did not heal quickly, especially on your legs and feet.   Are a woman and have a history of giving birth to very large babies or a history of repeated fetal loss (stillbirth).  Have had glucose in your urine or high blood sugar:   During pregnancy.   After a heart attack, surgery, or  prolonged periods of high stress.  The GTT lasts 3-4 hours. Other than the glucose solution, you will not be allowed to eat or drink anything during the test. You must remain at the testing location to make sure that your blood and urine samples are taken on time. PREPARATION FOR TEST Eat normally for 3 days prior to the GTT test, including having plenty of carbohydrate-rich foods. Do not eat or drink anything except water during the final 12 hours before the test. You should not smoke or exercise during the test. In addition, your health care provider may ask you to stop taking certain medicines before the test. RESULTS It is your responsibility to obtain your test results. Ask the lab or department performing the test when and how you will get your results. Contact your health care provider to discuss any questions you have about your results. Range of Normal Values Ranges for normal values may vary among different labs and hospitals. You should always check with your health care provider after having lab work or other tests done to discuss whether your values are considered within normal limits.  Normal levels of blood glucose are as follows:  Fasting: less than 110 mg/dL or less than 6.1 mmol/L (SI units).  1 hour after consuming the glucose drink: less than 200 mg/dL or less than 11.1 mmol/L.  2 hours after consuming the glucose drink: less than 140 mg/dL or less than 7.8 mmol/L.  3 hours after consuming the glucose drink: 70-115 mg/dL or less than 6.4 mmol/L.  4 hours after consuming the glucose drink: 70-115 mg/dL or less than 6.4 mmol/L. The normal result for the urine test is negative, meaning that glucose is absent from your urine. Some substances can interfere with GTT results. These may include:  Blood pressure and heart failure medicines, including beta blockers, furosemide, and thiazides.   Anti-inflammatory medicines, including aspirin.   Nicotine.   Some psychiatric  medicines.   Oral contraceptives.   Diuretics or corticosteroids. Meaning of Results Outside Normal Value Ranges GTT test results that are above normal values may indicate health problems, such as:  Diabetes mellitus.   Acute stress response.   Cushing syndrome.   Tumors such as pheochromocytoma or glucagonoma.   Chronic renal failure.   Pancreatitis.   Hyperthyroidism.   Current infection.  Discuss your test results with your health care provider. He or she will use the results to make a diagnosis and determine a treatment plan that is right for you.   This information is not intended   to replace advice given to you by your health care provider. Make sure you discuss any questions you have with your health care provider.   Document Released: 05/09/2004 Document Revised: 05/07/2014 Document Reviewed: 08/21/2013 Elsevier Interactive Patient Education 2016 [employer]  

## 2015-05-01 NOTE — L&D Delivery Note (Signed)
Operative Delivery Note At 9:31 PM a viable female was delivered via VBAC, Vacuum Assisted.  Presentation: vertex; Position: Left,, Occiput,, Anterior; Station: +3.  Verbal consent: obtained from patient.  Risks and benefits discussed in detail.  Risks include, but are not limited to the risks of anesthesia, bleeding, infection, damage to maternal tissues, fetal cephalhematoma.  There is also the risk of inability to effect vaginal delivery of the head, or shoulder dystocia that cannot be resolved by established maneuvers, leading to the need for emergency cesarean section.  APGAR: 7, 9; weight  .   Placenta status: , Manual removal Pathology.   Cord: 3 vessels with the following complications: None.  Cord pH: n/a  Anesthesia: Epidural  Instruments: kiwi vac Episiotomy:   Lacerations: 2nd degree;Perineal Suture Repair: 2.0 vicryl Est. Blood Loss (mL):    Mom to postpartum.  Baby to Couplet care / Skin to Skin.  Wyvonnia DuskyLAWSON, Bula Cavalieri DARLENE 08/07/2015, 9:53 PM

## 2015-05-11 ENCOUNTER — Encounter: Payer: Medicaid Other | Admitting: Advanced Practice Midwife

## 2015-05-19 ENCOUNTER — Encounter: Payer: Medicaid Other | Admitting: Obstetrics & Gynecology

## 2015-05-23 ENCOUNTER — Encounter (HOSPITAL_COMMUNITY): Payer: Self-pay

## 2015-05-23 ENCOUNTER — Ambulatory Visit (HOSPITAL_COMMUNITY)
Admission: RE | Admit: 2015-05-23 | Discharge: 2015-05-23 | Disposition: A | Discharge status: Home / Self Care | Payer: Medicaid Other | Source: Ambulatory Visit | Attending: Physician Assistant | Admitting: Physician Assistant

## 2015-05-23 ENCOUNTER — Other Ambulatory Visit (HOSPITAL_COMMUNITY): Payer: Self-pay | Admitting: Maternal and Fetal Medicine

## 2015-05-23 DIAGNOSIS — O34219 Maternal care for unspecified type scar from previous cesarean delivery: Secondary | ICD-10-CM | POA: Insufficient documentation

## 2015-05-23 DIAGNOSIS — Z3A29 29 weeks gestation of pregnancy: Secondary | ICD-10-CM

## 2015-05-23 DIAGNOSIS — O09523 Supervision of elderly multigravida, third trimester: Secondary | ICD-10-CM

## 2015-05-24 ENCOUNTER — Ambulatory Visit (INDEPENDENT_AMBULATORY_CARE_PROVIDER_SITE_OTHER): Payer: Medicaid Other | Admitting: Family

## 2015-05-24 VITALS — BP 97/60 | HR 79 | Temp 98.4°F | Wt 121.8 lb

## 2015-05-24 DIAGNOSIS — O09529 Supervision of elderly multigravida, unspecified trimester: Secondary | ICD-10-CM

## 2015-05-24 DIAGNOSIS — Z23 Encounter for immunization: Secondary | ICD-10-CM | POA: Diagnosis not present

## 2015-05-24 DIAGNOSIS — Z789 Other specified health status: Secondary | ICD-10-CM | POA: Diagnosis not present

## 2015-05-24 DIAGNOSIS — O09523 Supervision of elderly multigravida, third trimester: Secondary | ICD-10-CM

## 2015-05-24 DIAGNOSIS — Z98891 History of uterine scar from previous surgery: Secondary | ICD-10-CM

## 2015-05-24 LAB — CBC
HCT: 34.4 % — ABNORMAL LOW (ref 36.0–46.0)
Hemoglobin: 11.5 g/dL — ABNORMAL LOW (ref 12.0–15.0)
MCH: 26.9 pg (ref 26.0–34.0)
MCHC: 33.4 g/dL (ref 30.0–36.0)
MCV: 80.4 fL (ref 78.0–100.0)
MPV: 10.9 fL (ref 8.6–12.4)
PLATELETS: 192 10*3/uL (ref 150–400)
RBC: 4.28 MIL/uL (ref 3.87–5.11)
RDW: 12.9 % (ref 11.5–15.5)
WBC: 6.3 10*3/uL (ref 4.0–10.5)

## 2015-05-24 LAB — POCT URINALYSIS DIP (DEVICE)
Bilirubin Urine: NEGATIVE
Glucose, UA: NEGATIVE mg/dL
Hgb urine dipstick: NEGATIVE
Ketones, ur: 15 mg/dL — AB
Leukocytes, UA: NEGATIVE
NITRITE: NEGATIVE
PROTEIN: 30 mg/dL — AB
Specific Gravity, Urine: 1.025 (ref 1.005–1.030)
UROBILINOGEN UA: 1 mg/dL (ref 0.0–1.0)
pH: 7 (ref 5.0–8.0)

## 2015-05-24 MED ORDER — TETANUS-DIPHTH-ACELL PERTUSSIS 5-2.5-18.5 LF-MCG/0.5 IM SUSP
0.5000 mL | Freq: Once | INTRAMUSCULAR | Status: AC
  Administered 2015-05-24: 0.5 mL via INTRAMUSCULAR

## 2015-05-24 NOTE — Progress Notes (Signed)
Breastfeeding tip of the week reviewed 1 hr gtt, 28 week labs today Tdap today

## 2015-05-24 NOTE — Progress Notes (Signed)
Subjective:  Nancy Wright is a 40 y.o. G4P3003 at [redacted]w[redacted]d being seen today for ongoing prenatal care.  She is currently monitored for the following issues for this high-risk pregnancy and has High-risk pregnancy, multigravida of advanced maternal age, antepartum; H/O: cesarean section; Language barrier; and AMA (advanced maternal age) multigravida 35+ on her problem list.  Patient reports no complaints.  Contractions: Irregular. Vag. Bleeding: None.  Movement: Present. Denies leaking of fluid.   The following portions of the patient's history were reviewed and updated as appropriate: allergies, current medications, past family history, past medical history, past social history, past surgical history and problem list. Problem list updated.  Objective:   Filed Vitals:   05/24/15 1257  BP: 97/60  Pulse: 79  Temp: 98.4 F (36.9 C)  Weight: 121 lb 12.8 oz (55.248 kg)    Fetal Status: Fetal Heart Rate (bpm): 145 Fundal Height: 29 cm Movement: Present     General:  Alert, oriented and cooperative. Patient is in no acute distress.  Skin: Skin is warm and dry. No rash noted.   Cardiovascular: Normal heart rate noted  Respiratory: Normal respiratory effort, no problems with respiration noted  Abdomen: Soft, gravid, appropriate for gestational age. Pain/Pressure: Present     Pelvic: Vag. Bleeding: None     Cervical exam deferred        Extremities: Normal range of motion.  Edema: None  Mental Status: Normal mood and affect. Normal behavior. Normal judgment and thought content.   Urinalysis: Urine Protein: 1+ Urine Glucose: Negative  Assessment and Plan:  Pregnancy: G4P3003 at [redacted]w[redacted]d  1. High-risk pregnancy, multigravida of advanced maternal age, antepartum - Tdap (BOOSTRIX) injection 0.5 mL; Inject 0.5 mLs into the muscle once. - Glucose Tolerance, 1 HR (50g) w/o Fasting - HIV antibody (with reflex) - RPR - CBC  2. Language barrier - Interpreter present  3. AMA (advanced maternal age)  multigravida 35+, third trimester - NIPS normal  4. H/O: cesarean section - TOLAC consent signed today  Preterm labor symptoms and general obstetric precautions including but not limited to vaginal bleeding, contractions, leaking of fluid and fetal movement were reviewed in detail with the patient. Please refer to After Visit Summary for other counseling recommendations.  Return in about 2 weeks (around 06/07/2015).   Eino Farber Kennith Gain, CNM

## 2015-05-25 LAB — GLUCOSE TOLERANCE, 1 HOUR (50G) W/O FASTING: GLUCOSE 1 HOUR GTT: 123 mg/dL (ref 70–140)

## 2015-05-25 LAB — HIV ANTIBODY (ROUTINE TESTING W REFLEX): HIV: NONREACTIVE

## 2015-05-25 LAB — RPR

## 2015-05-27 ENCOUNTER — Encounter: Payer: Self-pay | Admitting: *Deleted

## 2015-06-08 ENCOUNTER — Ambulatory Visit (INDEPENDENT_AMBULATORY_CARE_PROVIDER_SITE_OTHER): Payer: Medicaid Other | Admitting: Family

## 2015-06-08 VITALS — BP 98/66 | HR 99 | Wt 122.0 lb

## 2015-06-08 DIAGNOSIS — O09523 Supervision of elderly multigravida, third trimester: Secondary | ICD-10-CM

## 2015-06-08 DIAGNOSIS — O09529 Supervision of elderly multigravida, unspecified trimester: Secondary | ICD-10-CM

## 2015-06-08 LAB — POCT URINALYSIS DIP (DEVICE)
Bilirubin Urine: NEGATIVE
Glucose, UA: NEGATIVE mg/dL
HGB URINE DIPSTICK: NEGATIVE
KETONES UR: NEGATIVE mg/dL
Nitrite: NEGATIVE
PH: 8 (ref 5.0–8.0)
PROTEIN: NEGATIVE mg/dL
Specific Gravity, Urine: 1.02 (ref 1.005–1.030)
Urobilinogen, UA: 0.2 mg/dL (ref 0.0–1.0)

## 2015-06-08 NOTE — Progress Notes (Signed)
Subjective:  Nancy Wright is a 40 y.o. G4P3003 at [redacted]w[redacted]d being seen today for ongoing prenatal care.  She is currently monitored for the following issues for this high-risk pregnancy and has High-risk pregnancy, multigravida of advanced maternal age, antepartum; H/O: cesarean section; Language barrier; and AMA (advanced maternal age) multigravida 35+ on her problem list.  Patient reports no complaints.  Contractions: Irregular. Vag. Bleeding: None.  Movement: Present. Denies leaking of fluid.   The following portions of the patient's history were reviewed and updated as appropriate: allergies, current medications, past family history, past medical history, past social history, past surgical history and problem list. Problem list updated.  Objective:   Filed Vitals:   06/08/15 0858  BP: 98/66  Pulse: 99  Weight: 122 lb (55.339 kg)    Fetal Status: Fetal Heart Rate (bpm): 156 Fundal Height: 32 cm Movement: Present     General:  Alert, oriented and cooperative. Patient is in no acute distress.  Skin: Skin is warm and dry. No rash noted.   Cardiovascular: Normal heart rate noted  Respiratory: Normal respiratory effort, no problems with respiration noted  Abdomen: Soft, gravid, appropriate for gestational age. Pain/Pressure: Present     Pelvic: Vag. Bleeding: None     Cervical exam deferred        Extremities: Normal range of motion.  Edema: None  Mental Status: Normal mood and affect. Normal behavior. Normal judgment and thought content.   Urinalysis: Urine Protein: Negative Urine Glucose: Negative  Assessment and Plan:  Pregnancy: G4P3003 at [redacted]w[redacted]d  1. High-risk pregnancy, multigravida of advanced maternal age, antepartum - Reviewed third trimester labs  2. AMA (advanced maternal age) multigravida 35+, third trimester    Preterm labor symptoms and general obstetric precautions including but not limited to vaginal bleeding, contractions, leaking of fluid and fetal movement were  reviewed in detail with the patient. Please refer to After Visit Summary for other counseling recommendations.  Return in about 2 weeks (around 06/22/2015).   Eino Farber Kennith Gain, CNM

## 2015-06-22 ENCOUNTER — Encounter: Payer: Self-pay | Admitting: Obstetrics and Gynecology

## 2015-06-22 ENCOUNTER — Ambulatory Visit (INDEPENDENT_AMBULATORY_CARE_PROVIDER_SITE_OTHER): Payer: Medicaid Other | Admitting: Obstetrics and Gynecology

## 2015-06-22 VITALS — BP 95/56 | HR 66 | Temp 98.0°F | Wt 125.2 lb

## 2015-06-22 DIAGNOSIS — O09523 Supervision of elderly multigravida, third trimester: Secondary | ICD-10-CM | POA: Diagnosis present

## 2015-06-22 DIAGNOSIS — O09529 Supervision of elderly multigravida, unspecified trimester: Secondary | ICD-10-CM

## 2015-06-22 LAB — POCT URINALYSIS DIP (DEVICE)
BILIRUBIN URINE: NEGATIVE
GLUCOSE, UA: NEGATIVE mg/dL
Hgb urine dipstick: NEGATIVE
KETONES UR: NEGATIVE mg/dL
NITRITE: NEGATIVE
PH: 7 (ref 5.0–8.0)
PROTEIN: NEGATIVE mg/dL
Specific Gravity, Urine: 1.02 (ref 1.005–1.030)
Urobilinogen, UA: 0.2 mg/dL (ref 0.0–1.0)

## 2015-06-22 MED ORDER — PRENATAL COMPLETE 14-0.4 MG PO TABS: 1.0000 | ORAL_TABLET | Freq: Every day | ORAL | Status: DC

## 2015-06-22 NOTE — Patient Instructions (Addendum)
Third Trimester of Pregnancy The third trimester is from week 29 through week 42, months 7 through 9. The third trimester is a time when the fetus is growing rapidly. At the end of the ninth month, the fetus is about 20 inches in length and weighs 6-10 pounds.  BODY CHANGES Your body goes through many changes during pregnancy. The changes vary from woman to woman.   Your weight will continue to increase. You can expect to gain 25-35 pounds (11-16 kg) by the end of the pregnancy.  You may begin to get stretch marks on your hips, abdomen, and breasts.  You may urinate more often because the fetus is moving lower into your pelvis and pressing on your bladder.  You may develop or continue to have heartburn as a result of your pregnancy.  You may develop constipation because certain hormones are causing the muscles that push waste through your intestines to slow down.  You may develop hemorrhoids or swollen, bulging veins (varicose veins).  You may have pelvic pain because of the weight gain and pregnancy hormones relaxing your joints between the bones in your pelvis. Backaches may result from overexertion of the muscles supporting your posture.  You may have changes in your hair. These can include thickening of your hair, rapid growth, and changes in texture. Some women also have hair loss during or after pregnancy, or hair that feels dry or thin. Your hair will most likely return to normal after your baby is born.  Your breasts will continue to grow and be tender. A yellow discharge may leak from your breasts called colostrum.  Your belly button may stick out.  You may feel short of breath because of your expanding uterus.  You may notice the fetus "dropping," or moving lower in your abdomen.  You may have a bloody mucus discharge. This usually occurs a few days to a week before labor begins.  Your cervix becomes thin and soft (effaced) near your due date. WHAT TO EXPECT AT YOUR PRENATAL  EXAMS  You will have prenatal exams every 2 weeks until week 36. Then, you will have weekly prenatal exams. During a routine prenatal visit:  You will be weighed to make sure you and the fetus are growing normally.  Your blood pressure is taken.  Your abdomen will be measured to track your baby's growth.  The fetal heartbeat will be listened to.  Any test results from the previous visit will be discussed.  You may have a cervical check near your due date to see if you have effaced. At around 36 weeks, your caregiver will check your cervix. At the same time, your caregiver will also perform a test on the secretions of the vaginal tissue. This test is to determine if a type of bacteria, Group B streptococcus, is present. Your caregiver will explain this further. Your caregiver may ask you:  What your birth plan is.  How you are feeling.  If you are feeling the baby move.  If you have had any abnormal symptoms, such as leaking fluid, bleeding, severe headaches, or abdominal cramping.  If you are using any tobacco products, including cigarettes, chewing tobacco, and electronic cigarettes.  If you have any questions. Other tests or screenings that may be performed during your third trimester include:  Blood tests that check for low iron levels (anemia).  Fetal testing to check the health, activity level, and growth of the fetus. Testing is done if you have certain medical conditions or if   there are problems during the pregnancy.  HIV (human immunodeficiency virus) testing. If you are at high risk, you may be screened for HIV during your third trimester of pregnancy. FALSE LABOR You may feel small, irregular contractions that eventually go away. These are called Braxton Hicks contractions, or false labor. Contractions may last for hours, days, or even weeks before true labor sets in. If contractions come at regular intervals, intensify, or become painful, it is best to be seen by your  caregiver.  SIGNS OF LABOR   Menstrual-like cramps.  Contractions that are 5 minutes apart or less.  Contractions that start on the top of the uterus and spread down to the lower abdomen and back.  A sense of increased pelvic pressure or back pain.  A watery or bloody mucus discharge that comes from the vagina. If you have any of these signs before the 37th week of pregnancy, call your caregiver right away. You need to go to the hospital to get checked immediately. HOME CARE INSTRUCTIONS   Avoid all smoking, herbs, alcohol, and unprescribed drugs. These chemicals affect the formation and growth of the baby.  Do not use any tobacco products, including cigarettes, chewing tobacco, and electronic cigarettes. If you need help quitting, ask your health care provider. You may receive counseling support and other resources to help you quit.  Follow your caregiver's instructions regarding medicine use. There are medicines that are either safe or unsafe to take during pregnancy.  Exercise only as directed by your caregiver. Experiencing uterine cramps is a good sign to stop exercising.  Continue to eat regular, healthy meals.  Wear a good support bra for breast tenderness.  Do not use hot tubs, steam rooms, or saunas.  Wear your seat belt at all times when driving.  Avoid raw meat, uncooked cheese, cat litter boxes, and soil used by cats. These carry germs that can cause birth defects in the baby.  Take your prenatal vitamins.  Take 1500-2000 mg of calcium daily starting at the 20th week of pregnancy until you deliver your baby.  Try taking a stool softener (if your caregiver approves) if you develop constipation. Eat more high-fiber foods, such as fresh vegetables or fruit and whole grains. Drink plenty of fluids to keep your urine clear or pale yellow.  Take warm sitz baths to soothe any pain or discomfort caused by hemorrhoids. Use hemorrhoid cream if your caregiver approves.  If  you develop varicose veins, wear support hose. Elevate your feet for 15 minutes, 3-4 times a day. Limit salt in your diet.  Avoid heavy lifting, wear low heal shoes, and practice good posture.  Rest a lot with your legs elevated if you have leg cramps or low back pain.  Visit your dentist if you have not gone during your pregnancy. Use a soft toothbrush to brush your teeth and be gentle when you floss.  A sexual relationship may be continued unless your caregiver directs you otherwise.  Do not travel far distances unless it is absolutely necessary and only with the approval of your caregiver.  Take prenatal classes to understand, practice, and ask questions about the labor and delivery.  Make a trial run to the hospital.  Pack your hospital bag.  Prepare the baby's nursery.  Continue to go to all your prenatal visits as directed by your caregiver. SEEK MEDICAL CARE IF:  You are unsure if you are in labor or if your water has broken.  You have dizziness.  You have   mild pelvic cramps, pelvic pressure, or nagging pain in your abdominal area.  You have persistent nausea, vomiting, or diarrhea.  You have a bad smelling vaginal discharge.  You have pain with urination. SEEK IMMEDIATE MEDICAL CARE IF:   You have a fever.  You are leaking fluid from your vagina.  You have spotting or bleeding from your vagina.  You have severe abdominal cramping or pain.  You have rapid weight loss or gain.  You have shortness of breath with chest pain.  You notice sudden or extreme swelling of your face, hands, ankles, feet, or legs.  You have not felt your baby move in over an hour.  You have severe headaches that do not go away with medicine.  You have vision changes.   This information is not intended to replace advice given to you by your health care provider. Make sure you discuss any questions you have with your health care provider.   Document Released: 04/10/2001 Document  Revised: 05/07/2014 Document Reviewed: 06/17/2012 Elsevier Interactive Patient Education 2016 Else[employer] Inc[employer] Contraception Choices Contraception (birth control) is the use of any methods or devices to prevent pregnancy. Below are some methods to help avoid pregnancy. HORMONAL METHODS   Contraceptive implant. This is a thin, plastic tube containing progesterone hormone. It does not contain estrogen hormone. Your health care provider inserts the tube in the inner part of the upper arm. The tube can remain in place for up to 3 years. After 3 years, the implant must be removed. The implant prevents the ovaries from releasing an egg (ovulation), thickens the cervical mucus to prevent sperm from entering the uterus, and thins the lining of the inside of the uterus.  Progesterone-only injections. These injections are given every 3 months by your health care provider to prevent pregnancy. This synthetic progesterone hormone stops the ovaries from releasing eggs. It also thickens cervical mucus and changes the uterine lining. This makes it harder for sperm to survive in the uterus.  Birth control pills. These pills contain estrogen and progesterone hormone. They work by preventing the ovaries from releasing eggs (ovulation). They also cause the cervical mucus to thicken, preventing the sperm from entering the uterus. Birth control pills are prescribed by a health care provider.Birth control pills can also be used to treat heavy periods.  Minipill. This type of birth control pill contains only the progesterone hormone. They are taken every day of each month and must be prescribed by your health care provider.  Birth control patch. The patch contains hormones similar to those in birth control pills. It must be changed once a week and is prescribed by a health care provider.  Vaginal ring. The ring contains hormones similar to those in birth control pills. It is left in the vagina for 3 weeks, removed for 1  week, and then a new one is put back in place. The patient must be comfortable inserting and removing the ring from the vagina.A health care provider's prescription is necessary.  Emergency contraception. Emergency contraceptives prevent pregnancy after unprotected sexual intercourse. This pill can be taken right after sex or up to 5 days after unprotected sex. It is most effective the sooner you take the pills after having sexual intercourse. Most emergency contraceptive pills are available without a prescription. Check with your pharmacist. Do not use emergency contraception as your only form of birth control. BARRIER METHODS   Female condom. This is a thin sheath (latex or rubber) that is worn over the penis during   sexual intercourse. It can be used with spermicide to increase effectiveness.  Female condom. This is a soft, loose-fitting sheath that is put into the vagina before sexual intercourse.  Diaphragm. This is a soft, latex, dome-shaped barrier that must be fitted by a health care provider. It is inserted into the vagina, along with a spermicidal jelly. It is inserted before intercourse. The diaphragm should be left in the vagina for 6 to 8 hours after intercourse.  Cervical cap. This is a round, soft, latex or plastic cup that fits over the cervix and must be fitted by a health care provider. The cap can be left in place for up to 48 hours after intercourse.  Sponge. This is a soft, circular piece of polyurethane foam. The sponge has spermicide in it. It is inserted into the vagina after wetting it and before sexual intercourse.  Spermicides. These are chemicals that kill or block sperm from entering the cervix and uterus. They come in the form of creams, jellies, suppositories, foam, or tablets. They do not require a prescription. They are inserted into the vagina with an applicator before having sexual intercourse. The process must be repeated every time you have sexual  intercourse. INTRAUTERINE CONTRACEPTION  Intrauterine device (IUD). This is a T-shaped device that is put in a woman's uterus during a menstrual period to prevent pregnancy. There are 2 types:  Copper IUD. This type of IUD is wrapped in copper wire and is placed inside the uterus. Copper makes the uterus and fallopian tubes produce a fluid that kills sperm. It can stay in place for 10 years.  Hormone IUD. This type of IUD contains the hormone progestin (synthetic progesterone). The hormone thickens the cervical mucus and prevents sperm from entering the uterus, and it also thins the uterine lining to prevent implantation of a fertilized egg. The hormone can weaken or kill the sperm that get into the uterus. It can stay in place for 3-5 years, depending on which type of IUD is used. PERMANENT METHODS OF CONTRACEPTION  Female tubal ligation. This is when the woman's fallopian tubes are surgically sealed, tied, or blocked to prevent the egg from traveling to the uterus.  Hysteroscopic sterilization. This involves placing a small coil or insert into each fallopian tube. Your doctor uses a technique called hysteroscopy to do the procedure. The device causes scar tissue to form. This results in permanent blockage of the fallopian tubes, so the sperm cannot fertilize the egg. It takes about 3 months after the procedure for the tubes to become blocked. You must use another form of birth control for these 3 months.  Female sterilization. This is when the female has the tubes that carry sperm tied off (vasectomy).This blocks sperm from entering the vagina during sexual intercourse. After the procedure, the man can still ejaculate fluid (semen). NATURAL PLANNING METHODS  Natural family planning. This is not having sexual intercourse or using a barrier method (condom, diaphragm, cervical cap) on days the woman could become pregnant.  Calendar method. This is keeping track of the length of each menstrual cycle  and identifying when you are fertile.  Ovulation method. This is avoiding sexual intercourse during ovulation.  Symptothermal method. This is avoiding sexual intercourse during ovulation, using a thermometer and ovulation symptoms.  Post-ovulation method. This is timing sexual intercourse after you have ovulated. Regardless of which type or method of contraception you choose, it is important that you use condoms to protect against the transmission of sexually transmitted infections (STIs).   Talk with your health care provider about which form of contraception is most appropriate for you.   This information is not intended to replace advice given to you by your health care provider. Make sure you discuss any questions you have with your health care provider.   Document Released: 04/16/2005 Document Revised: 04/21/2013 Document Reviewed: 10/09/2012 Elsevier Interactive Patient Education 2016 Elsevier Inc.  

## 2015-06-22 NOTE — Progress Notes (Signed)
Interpreter Azucena Cecil present for encounter.

## 2015-06-22 NOTE — Progress Notes (Signed)
Subjective:  Nancy Wright is a 40 y.o. G4P3003 at [redacted]w[redacted]d being seen today for ongoing prenatal care.  She is currently monitored for the following issues for this low-risk pregnancy and has High-risk pregnancy, multigravida of advanced maternal age, antepartum; H/O: cesarean section; Language barrier; and AMA (advanced maternal age) multigravida 35+ on her problem list. Hx LGA with no SD or GDM. Still desires TOLAC  Patient reports no complaints.  Contractions: Irregular. Vag. Bleeding: None.  Movement: Present. Denies leaking of fluid.   The following portions of the patient's history were reviewed and updated as appropriate: allergies, current medications, past family history, past medical history, past social history, past surgical history and problem list. Problem list updated.  Objective:   Filed Vitals:   06/22/15 1106  BP: 95/56  Pulse: 66  Temp: 98 F (36.7 C)  Weight: 125 lb 3.2 oz (56.79 kg)    Fetal Status: Fetal Heart Rate (bpm): 148   Movement: Present     General:  Alert, oriented and cooperative. Patient is in no acute distress.  Skin: Skin is warm and dry. No rash noted.   Cardiovascular: Normal heart rate noted  Respiratory: Normal respiratory effort, no problems with respiration noted  Abdomen: Soft, gravid, appropriate for gestational age. Pain/Pressure: Present     Pelvic: Vag. Bleeding: None     Cervical exam deferred        Extremities: Normal range of motion.  Edema: None  Mental Status: Normal mood and affect. Normal behavior. Normal judgment and thought content.   Urinalysis: Urine Protein: Negative Urine Glucose: Negative  Assessment and Plan:  Pregnancy: G4P3003 at [redacted]w[redacted]d  1. AMA (advanced maternal age) multigravida 35+, third trimester Doing well  Preterm labor symptoms and general obstetric precautions including but not limited to vaginal bleeding, contractions, leaking of fluid and fetal movement were reviewed in detail with the patient. Please refer  to After Visit Summary for other counseling recommendations.  Return in about 2 weeks (around 07/06/2015). Rx PNVs given LARC encouraged.   Danae Orleans, CNM

## 2015-07-06 ENCOUNTER — Encounter: Payer: Medicaid Other | Admitting: Certified Nurse Midwife

## 2015-07-13 ENCOUNTER — Other Ambulatory Visit (HOSPITAL_COMMUNITY)
Admission: RE | Admit: 2015-07-13 | Discharge: 2015-07-13 | Disposition: A | Discharge status: Home / Self Care | Payer: Medicaid Other | Source: Ambulatory Visit | Attending: Advanced Practice Midwife | Admitting: Advanced Practice Midwife

## 2015-07-13 ENCOUNTER — Ambulatory Visit (INDEPENDENT_AMBULATORY_CARE_PROVIDER_SITE_OTHER): Payer: Medicaid Other | Admitting: Advanced Practice Midwife

## 2015-07-13 VITALS — BP 100/61 | HR 79 | Temp 98.3°F | Wt 128.1 lb

## 2015-07-13 DIAGNOSIS — O09523 Supervision of elderly multigravida, third trimester: Secondary | ICD-10-CM | POA: Diagnosis not present

## 2015-07-13 DIAGNOSIS — Z113 Encounter for screening for infections with a predominantly sexual mode of transmission: Secondary | ICD-10-CM | POA: Diagnosis not present

## 2015-07-13 LAB — POCT URINALYSIS DIP (DEVICE)
Bilirubin Urine: NEGATIVE
Glucose, UA: NEGATIVE mg/dL
Ketones, ur: NEGATIVE mg/dL
Nitrite: NEGATIVE
Protein, ur: NEGATIVE mg/dL
Specific Gravity, Urine: 1.015 (ref 1.005–1.030)
Urobilinogen, UA: 0.2 mg/dL (ref 0.0–1.0)
pH: 7 (ref 5.0–8.0)

## 2015-07-13 LAB — OB RESULTS CONSOLE GBS: GBS: NEGATIVE

## 2015-07-13 NOTE — Progress Notes (Signed)
Subjective:  Nancy Wright is a 40 y.o. G4P3003 at 5433w1d being seen today for ongoing prenatal care.  She is currently monitored for the following issues for this high-risk pregnancy and has High-risk pregnancy, multigravida of advanced maternal age, antepartum; H/O: cesarean section; Language barrier; and AMA (advanced maternal age) multigravida 35+ on her problem list.  Patient reports no complaints.  Contractions: Irregular. Vag. Bleeding: None.  Movement: Present. Denies leaking of fluid.   The following portions of the patient's history were reviewed and updated as appropriate: allergies, current medications, past family history, past medical history, past social history, past surgical history and problem list. Problem list updated.  Objective:   Filed Vitals:   07/13/15 1118  BP: 100/61  Pulse: 79  Temp: 98.3 F (36.8 C)  Weight: 128 lb 1.6 oz (58.106 kg)    Fetal Status: Fetal Heart Rate (bpm): 140 Fundal Height: 36 cm Movement: Present  Presentation: Vertex  General:  Alert, oriented and cooperative. Patient is in no acute distress.  Skin: Skin is warm and dry. No rash noted.   Cardiovascular: Normal heart rate noted  Respiratory: Normal respiratory effort, no problems with respiration noted  Abdomen: Soft, gravid, appropriate for gestational age. Pain/Pressure: Absent     Pelvic: Vag. Bleeding: None     Cervical exam performed Dilation: Fingertip Effacement (%): 0 Station: -3  Extremities: Normal range of motion.  Edema: None  Mental Status: Normal mood and affect. Normal behavior. Normal judgment and thought content.   Urinalysis: Urine Protein: Negative Urine Glucose: Negative  Assessment and Plan:  Pregnancy: G4P3003 at 6233w1d  1. AMA (advanced maternal age) multigravida 35+, third trimester  - [facility]lam[facility] - Culture, beta strep (group b only) --US 07/26/15, twice weekly testing at 36 weeks --Vertex position noted by Leopolds  Term  labor symptoms and general obstetric precautions including but not limited to vaginal bleeding, contractions, leaking of fluid and fetal movement were reviewed in detail with the patient. Please refer to After Visit Summary for other counseling recommendations.  Return in about 1 week (around 07/20/2015).   Hurshel PartyLisa A Leftwich-Kirby, CNM

## 2015-07-13 NOTE — Progress Notes (Signed)
Used Interpreter Murphy OilFeryal Yousef. Urinalysis shows large leukocytes , trace hemoglobin.

## 2015-07-14 LAB — GC/CHLAMYDIA PROBE AMP (~~LOC~~) NOT AT ARMC
CHLAMYDIA, DNA PROBE: NEGATIVE
Neisseria Gonorrhea: NEGATIVE

## 2015-07-15 LAB — CULTURE, BETA STREP (GROUP B ONLY)

## 2015-07-20 ENCOUNTER — Ambulatory Visit (INDEPENDENT_AMBULATORY_CARE_PROVIDER_SITE_OTHER): Payer: Medicaid Other | Admitting: Family

## 2015-07-20 VITALS — BP 113/68 | HR 86 | Temp 98.7°F | Wt 129.0 lb

## 2015-07-20 DIAGNOSIS — O09523 Supervision of elderly multigravida, third trimester: Secondary | ICD-10-CM

## 2015-07-20 DIAGNOSIS — O34219 Maternal care for unspecified type scar from previous cesarean delivery: Secondary | ICD-10-CM

## 2015-07-20 DIAGNOSIS — O09529 Supervision of elderly multigravida, unspecified trimester: Secondary | ICD-10-CM

## 2015-07-20 DIAGNOSIS — Z98891 History of uterine scar from previous surgery: Secondary | ICD-10-CM

## 2015-07-20 LAB — POCT URINALYSIS DIP (DEVICE)
BILIRUBIN URINE: NEGATIVE
GLUCOSE, UA: NEGATIVE mg/dL
Hgb urine dipstick: NEGATIVE
KETONES UR: NEGATIVE mg/dL
Nitrite: NEGATIVE
PH: 7 (ref 5.0–8.0)
PROTEIN: NEGATIVE mg/dL
SPECIFIC GRAVITY, URINE: 1.02 (ref 1.005–1.030)
Urobilinogen, UA: 0.2 mg/dL (ref 0.0–1.0)

## 2015-07-20 NOTE — Progress Notes (Signed)
Subjective:  Nancy Wright is a 40 y.o. G4P3003 at 3072w1d being seen today for ongoing prenatal care.  She is currently monitored for the following issues for this high-risk pregnancy and has High-risk pregnancy, multigravida of advanced maternal age, antepartum; H/O: cesarean section; Language barrier; and AMA (advanced maternal age) multigravida 35+ on her problem list.  Patient reports occasional contractions.  Contractions: Irregular.  .  Movement: Present. Denies leaking of fluid.   The following portions of the patient's history were reviewed and updated as appropriate: allergies, current medications, past family history, past medical history, past social history, past surgical history and problem list. Problem list updated.  Objective:   Filed Vitals:   07/20/15 1024  BP: 113/68  Pulse: 86  Temp: 98.7 F (37.1 C)  Weight: 129 lb (58.514 kg)    Fetal Status: Fetal Heart Rate (bpm): 135 Fundal Height: 37 cm Movement: Present  Presentation: Vertex  General:  Alert, oriented and cooperative. Patient is in no acute distress.  Skin: Skin is warm and dry. No rash noted.   Cardiovascular: Normal heart rate noted  Respiratory: Normal respiratory effort, no problems with respiration noted  Abdomen: Soft, gravid, appropriate for gestational age. Pain/Pressure: Present     Pelvic:       Cervical exam deferred        Extremities: Normal range of motion.  Edema: None  Mental Status: Normal mood and affect. Normal behavior. Normal judgment and thought content.   Urinalysis: Urine Protein: Negative Urine Glucose: Negative  Assessment and Plan:  Pregnancy: G4P3003 at 8972w1d  1. High-risk pregnancy, multigravida of advanced maternal age, antepartum - Reviewed GBS results  2. H/O: cesarean section - Discussed what to report; vaginal bleeding and constant abdominal pain; instructed her to go to [facility]  3. AMA (advanced maternal age) multigravida 35+, third trimester - NIPS wnl; under 39  yo  Term labor symptoms and general obstetric precautions including but not limited to vaginal bleeding, contractions, leaking of fluid and fetal movement were reviewed in detail with the patient. Please refer to After Visit Summary for other counseling recommendations.  Return in about 1 week (around 07/27/2015).   Eino FarberWalidah Kennith GainN Karim, CNM

## 2015-07-27 ENCOUNTER — Ambulatory Visit (INDEPENDENT_AMBULATORY_CARE_PROVIDER_SITE_OTHER): Payer: Medicaid Other | Admitting: Family

## 2015-07-27 VITALS — BP 104/64 | HR 87 | Temp 98.6°F | Wt 130.0 lb

## 2015-07-27 DIAGNOSIS — O09529 Supervision of elderly multigravida, unspecified trimester: Secondary | ICD-10-CM

## 2015-07-27 DIAGNOSIS — Z98891 History of uterine scar from previous surgery: Secondary | ICD-10-CM

## 2015-07-27 DIAGNOSIS — O09523 Supervision of elderly multigravida, third trimester: Secondary | ICD-10-CM

## 2015-07-27 DIAGNOSIS — O34219 Maternal care for unspecified type scar from previous cesarean delivery: Secondary | ICD-10-CM

## 2015-07-27 DIAGNOSIS — Z789 Other specified health status: Secondary | ICD-10-CM

## 2015-07-27 LAB — POCT URINALYSIS DIP (DEVICE)
BILIRUBIN URINE: NEGATIVE
Glucose, UA: NEGATIVE mg/dL
HGB URINE DIPSTICK: NEGATIVE
Ketones, ur: NEGATIVE mg/dL
NITRITE: NEGATIVE
PH: 7 (ref 5.0–8.0)
Protein, ur: NEGATIVE mg/dL
Specific Gravity, Urine: 1.02 (ref 1.005–1.030)
UROBILINOGEN UA: 0.2 mg/dL (ref 0.0–1.0)

## 2015-07-27 NOTE — Progress Notes (Signed)
Subjective:  Nancy Wright is a 40 y.o. G4P3003 at 4036w1d being seen today for ongoing prenatal care.  She is currently monitored for the following issues for this high-risk pregnancy and has High-risk pregnancy, multigravida of advanced maternal age, antepartum; H/O: cesarean section; Language barrier; and AMA (advanced maternal age) multigravida 35+ on her problem list.  Patient reports occasional contractions.  Contractions: Not present. Vag. Bleeding: None.  Movement: Present. Denies leaking of fluid.   The following portions of the patient's history were reviewed and updated as appropriate: allergies, current medications, past family history, past medical history, past social history, past surgical history and problem list. Problem list updated.  Objective:   Filed Vitals:   07/27/15 1056  BP: 104/64  Pulse: 87  Temp: 98.6 F (37 C)  Weight: 130 lb (58.968 kg)    Fetal Status: Fetal Heart Rate (bpm): 148 Fundal Height: 37 cm Movement: Present     General:  Alert, oriented and cooperative. Patient is in no acute distress.  Skin: Skin is warm and dry. No rash noted.   Cardiovascular: Normal heart rate noted  Respiratory: Normal respiratory effort, no problems with respiration noted  Abdomen: Soft, gravid, appropriate for gestational age. Pain/Pressure: Present     Pelvic: Vag. Bleeding: None     Cervical exam deferred        Extremities: Normal range of motion.  Edema: Trace  Mental Status: Normal mood and affect. Normal behavior. Normal judgment and thought content.   Urinalysis:    Protein negative Glucose neg  Assessment and Plan:  Pregnancy: G4P3003 at 3736w1d  1. Language barrier - Video interpreter Claudine [phone]#[id number] utilized  2. High-risk pregnancy, multigravida of advanced maternal age, antepartum - continue monitoring -NST at next visit  3. H/O: cesarean section - Reviewed precautions; go to [facility] for bleeding, constant pain, or decreased fetal movement  Term labor  symptoms and general obstetric precautions including but not limited to vaginal bleeding, contractions, leaking of fluid and fetal movement were reviewed in detail with the patient. Please refer to After Visit Summary for other counseling recommendations.  Return in about 1 week (around 08/03/2015) for appt and NST.   Eino FarberWalidah Kennith GainN Karim, CNM

## 2015-07-27 NOTE — Progress Notes (Signed)
Used Air cabin crewVideo Interpreter Claudine#[id number].c/o swelling in legs  when sits down  A lot.

## 2015-08-03 ENCOUNTER — Ambulatory Visit (INDEPENDENT_AMBULATORY_CARE_PROVIDER_SITE_OTHER): Payer: Medicaid Other | Admitting: Advanced Practice Midwife

## 2015-08-03 VITALS — BP 110/64 | HR 72 | Temp 98.5°F | Wt 131.0 lb

## 2015-08-03 DIAGNOSIS — O480 Post-term pregnancy: Secondary | ICD-10-CM | POA: Diagnosis not present

## 2015-08-03 DIAGNOSIS — O09529 Supervision of elderly multigravida, unspecified trimester: Secondary | ICD-10-CM

## 2015-08-03 LAB — POCT URINALYSIS DIP (DEVICE)
Bilirubin Urine: NEGATIVE
Glucose, UA: NEGATIVE mg/dL
Hgb urine dipstick: NEGATIVE
Ketones, ur: NEGATIVE mg/dL
NITRITE: NEGATIVE
PH: 6.5 (ref 5.0–8.0)
PROTEIN: NEGATIVE mg/dL
Specific Gravity, Urine: 1.015 (ref 1.005–1.030)
UROBILINOGEN UA: 0.2 mg/dL (ref 0.0–1.0)

## 2015-08-03 NOTE — Progress Notes (Signed)
Nancy MansonSarai used for interpreter

## 2015-08-04 ENCOUNTER — Telehealth (HOSPITAL_COMMUNITY): Payer: Self-pay | Admitting: *Deleted

## 2015-08-04 NOTE — Telephone Encounter (Signed)
Preadmission screen  

## 2015-08-05 ENCOUNTER — Ambulatory Visit (INDEPENDENT_AMBULATORY_CARE_PROVIDER_SITE_OTHER): Payer: Medicaid Other | Admitting: *Deleted

## 2015-08-05 VITALS — BP 105/64 | HR 78

## 2015-08-05 DIAGNOSIS — Z36 Encounter for antenatal screening of mother: Secondary | ICD-10-CM

## 2015-08-05 DIAGNOSIS — O480 Post-term pregnancy: Secondary | ICD-10-CM | POA: Diagnosis present

## 2015-08-05 NOTE — Progress Notes (Signed)
IOL scheduled 4/9

## 2015-08-07 ENCOUNTER — Encounter (HOSPITAL_COMMUNITY): Payer: Self-pay

## 2015-08-07 ENCOUNTER — Inpatient Hospital Stay (HOSPITAL_COMMUNITY)
Admission: RE | Admit: 2015-08-07 | Discharge: 2015-08-09 | DRG: 775 | Disposition: A | Discharge status: Home / Self Care | Payer: Medicaid Other | Source: Ambulatory Visit | Attending: Obstetrics & Gynecology | Admitting: Obstetrics & Gynecology

## 2015-08-07 ENCOUNTER — Inpatient Hospital Stay (HOSPITAL_COMMUNITY): Payer: Medicaid Other | Admitting: Anesthesiology

## 2015-08-07 ENCOUNTER — Encounter (HOSPITAL_COMMUNITY): Payer: Self-pay | Admitting: Anesthesiology

## 2015-08-07 VITALS — BP 97/46 | HR 74 | Temp 98.4°F | Resp 16 | Ht 62.0 in | Wt 131.0 lb

## 2015-08-07 DIAGNOSIS — O09523 Supervision of elderly multigravida, third trimester: Secondary | ICD-10-CM

## 2015-08-07 DIAGNOSIS — O34211 Maternal care for low transverse scar from previous cesarean delivery: Secondary | ICD-10-CM | POA: Diagnosis present

## 2015-08-07 DIAGNOSIS — Z8249 Family history of ischemic heart disease and other diseases of the circulatory system: Secondary | ICD-10-CM

## 2015-08-07 DIAGNOSIS — Z3A40 40 weeks gestation of pregnancy: Secondary | ICD-10-CM | POA: Diagnosis not present

## 2015-08-07 DIAGNOSIS — O34219 Maternal care for unspecified type scar from previous cesarean delivery: Secondary | ICD-10-CM

## 2015-08-07 DIAGNOSIS — Z833 Family history of diabetes mellitus: Secondary | ICD-10-CM

## 2015-08-07 DIAGNOSIS — O480 Post-term pregnancy: Secondary | ICD-10-CM | POA: Diagnosis present

## 2015-08-07 LAB — ABO/RH: ABO/RH(D): O POS

## 2015-08-07 LAB — TYPE AND SCREEN
ABO/RH(D): O POS
ANTIBODY SCREEN: NEGATIVE

## 2015-08-07 LAB — CBC
HCT: 40.4 % (ref 36.0–46.0)
HEMOGLOBIN: 13.6 g/dL (ref 12.0–15.0)
MCH: 26.6 pg (ref 26.0–34.0)
MCHC: 33.7 g/dL (ref 30.0–36.0)
MCV: 78.9 fL (ref 78.0–100.0)
Platelets: 181 10*3/uL (ref 150–400)
RBC: 5.12 MIL/uL — AB (ref 3.87–5.11)
RDW: 17 % — ABNORMAL HIGH (ref 11.5–15.5)
WBC: 7.5 10*3/uL (ref 4.0–10.5)

## 2015-08-07 LAB — RPR: RPR Ser Ql: NONREACTIVE

## 2015-08-07 MED ORDER — OXYTOCIN 10 UNIT/ML IJ SOLN
1.0000 m[IU]/min | INTRAVENOUS | Status: DC
  Administered 2015-08-07: 1 m[IU]/min via INTRAVENOUS

## 2015-08-07 MED ORDER — LACTATED RINGERS IV SOLN
500.0000 mL | INTRAVENOUS | Status: DC | PRN
  Administered 2015-08-07: 1000 mL via INTRAVENOUS
  Administered 2015-08-07: 500 mL via INTRAVENOUS
  Administered 2015-08-07: 200 mL via INTRAVENOUS
  Administered 2015-08-07: 500 mL via INTRAVENOUS

## 2015-08-07 MED ORDER — CEFAZOLIN SODIUM-DEXTROSE 2-4 GM/100ML-% IV SOLN
2.0000 g | Freq: Once | INTRAVENOUS | Status: AC
  Administered 2015-08-07: 2 g via INTRAVENOUS
  Filled 2015-08-07: qty 100

## 2015-08-07 MED ORDER — FENTANYL 2.5 MCG/ML BUPIVACAINE 1/10 % EPIDURAL INFUSION (WH - ANES)
12.0000 mL/h | INTRAMUSCULAR | Status: DC | PRN
  Administered 2015-08-07: 14 mL/h via EPIDURAL

## 2015-08-07 MED ORDER — LACTATED RINGERS IV SOLN
INTRAVENOUS | Status: DC
  Administered 2015-08-07 (×3): via INTRAVENOUS

## 2015-08-07 MED ORDER — PHENYLEPHRINE 40 MCG/ML (10ML) SYRINGE FOR IV PUSH (FOR BLOOD PRESSURE SUPPORT)
80.0000 ug | PREFILLED_SYRINGE | INTRAVENOUS | Status: DC | PRN
  Administered 2015-08-07: 40 ug via INTRAVENOUS
  Filled 2015-08-07: qty 20

## 2015-08-07 MED ORDER — EPHEDRINE 5 MG/ML INJ
10.0000 mg | INTRAVENOUS | Status: DC | PRN
  Administered 2015-08-07: 10 mg via INTRAVENOUS

## 2015-08-07 MED ORDER — CEFAZOLIN SODIUM-DEXTROSE 2-4 GM/100ML-% IV SOLN: 2.0000 g | Freq: Three times a day (TID) | INTRAVENOUS | Status: DC

## 2015-08-07 MED ORDER — ONDANSETRON HCL 4 MG/2ML IJ SOLN: 4.0000 mg | Freq: Four times a day (QID) | INTRAMUSCULAR | Status: DC | PRN

## 2015-08-07 MED ORDER — ACETAMINOPHEN 325 MG PO TABS: 650.0000 mg | ORAL_TABLET | ORAL | Status: DC | PRN

## 2015-08-07 MED ORDER — LIDOCAINE HCL (PF) 1 % IJ SOLN
30.0000 mL | INTRAMUSCULAR | Status: DC | PRN
Start: 2015-08-07 — End: 2015-08-07
  Filled 2015-08-07: qty 30

## 2015-08-07 MED ORDER — LIDOCAINE HCL (PF) 1 % IJ SOLN
INTRAMUSCULAR | Status: DC | PRN
  Administered 2015-08-07 (×2): 4 mL

## 2015-08-07 MED ORDER — EPHEDRINE 5 MG/ML INJ: 10.0000 mg | INTRAVENOUS | Status: DC | PRN

## 2015-08-07 MED ORDER — LACTATED RINGERS IV SOLN: 500.0000 mL | Freq: Once | INTRAVENOUS | Status: DC

## 2015-08-07 MED ORDER — CITRIC ACID-SODIUM CITRATE 334-500 MG/5ML PO SOLN: 30.0000 mL | ORAL | Status: DC | PRN

## 2015-08-07 MED ORDER — PHENYLEPHRINE 40 MCG/ML (10ML) SYRINGE FOR IV PUSH (FOR BLOOD PRESSURE SUPPORT): 80.0000 ug | PREFILLED_SYRINGE | INTRAVENOUS | Status: DC | PRN

## 2015-08-07 MED ORDER — OXYTOCIN 10 UNIT/ML IJ SOLN
2.5000 [IU]/h | INTRAVENOUS | Status: DC
  Filled 2015-08-07: qty 4

## 2015-08-07 MED ORDER — FENTANYL 2.5 MCG/ML BUPIVACAINE 1/10 % EPIDURAL INFUSION (WH - ANES)
14.0000 mL/h | INTRAMUSCULAR | Status: DC | PRN
Start: 2015-08-07 — End: 2015-08-07
  Filled 2015-08-07: qty 125

## 2015-08-07 MED ORDER — TERBUTALINE SULFATE 1 MG/ML IJ SOLN: 0.2500 mg | Freq: Once | INTRAMUSCULAR | Status: DC | PRN

## 2015-08-07 MED ORDER — OXYTOCIN BOLUS FROM INFUSION: 500.0000 mL | INTRAVENOUS | Status: DC

## 2015-08-07 MED ORDER — DIPHENHYDRAMINE HCL 50 MG/ML IJ SOLN: 12.5000 mg | INTRAMUSCULAR | Status: DC | PRN

## 2015-08-07 NOTE — H&P (Signed)
LABOR ADMISSION HISTORY AND PHYSICAL  Nancy Wright is a 40 y.o. female [phone] with IUP at [redacted]w[redacted]d by LMP/early ultrasound presenting for IOL/TOLAC. She reports +FMs.  Denies LOF, VB, blurry vision, headaches, peripheral edema, RUQ pain.  She plans on breast feeding. She request NFP for birth control.  Plans on bringing child to [facility] for pediatric care after discharge.  Arabic interpretation provided by [employer].  Please see RN note for details.  Dating: By LMP, early ultrasound --->  Estimated Date of Delivery: 08/02/15  Sono:    , normal anatomy, cephalic presentation, 1537g, 45% EFW  Prenatal History/Complications:  Prenatal history significant for c-section in [place] with her last child.   [facility] Prenatal Labs  Dating  6 wk ultrasound Blood type: O/POS/-- (09/02 1114)   Genetic Screen 1 Screen: declined,  NT normal Quad: Declined NIPS: low risk Antibody:NEG (09/02 1114)  Anatomic [place] Nml @ 19 wks Rubella: 2.67 (09/02 1114)  GTT Early:               Third trimester: 123 RPR: NON REAC (09/02 1114)   Flu vaccine  12/31/14 HBsAg: NEGATIVE (09/02 1114)   TDaP vaccine  05/24/15                                             Rhogam: NA HIV: NONREACTIVE (09/02 1114)   GBS   neg                                          (For PCN allergy, check sensitivities) GBS: Neg  Contraception Plans nonhormonal; LARC encouraged if desires contraception Pap: Negative; HR HPV  Baby Food  breast   Circumcision  Female   Pediatrician  [facility]   Support Person  Spouse - "Demes"     Past Medical History: Past Medical History  Diagnosis Date  . Medical history non-contributory     Past Surgical History: Past Surgical History  Procedure Laterality Date  . Cesarean section      Obstetrical History: OB History    Gravida Para Term Preterm AB TAB SAB Ectopic Multiple Living   Social History: Social History   Social History  . Marital Status: Married     Spouse Name: N/A  . Number of Children: N/A  . Years of Education: N/A   Social History Main Topics  . Smoking status: Never Smoker   . Smokeless tobacco: Never Used  . Alcohol Use: No  . Drug Use: No  . Sexual Activity: Yes    Birth Control/ Protection: None   Other Topics Concern  . Not on file   Social History Narrative    Family History: Family History  Problem Relation Age of Onset  . Diabetes Father   . Hypertension Father     Allergies: No Known Allergies  Prescriptions prior to admission  Medication Sig Dispense Refill Last Dose  . Prenatal Vit-Fe Fumarate-FA (PRENATAL COMPLETE) 14-0.4 MG TABS Take 1 tablet by mouth daily. 30 each 11 Taking     Review of Systems   All systems reviewed and negative except as stated in HPI  There were no vitals taken for this visit. General  appearance: alert, cooperative, appears stated age and no distress Lungs: clear to auscultation bilaterally, no increased WOB Heart: regular rate and rhythm, no m/r/g Abdomen: soft, non-tender; bowel sounds normal Pelvic: SVE cl/th/pos/high/firm. Vertex confirmed by u/s Extremities: WWP, Homans sign is negative, no sign of DVT, +2 DP Neuro: follows commands, no focal deficits Presentation:    Prenatal labs: ABO, Rh: O/POS/-- (09/02 1114) Antibody: NEG (09/02 1114) Rubella: !Error!IMMUNE RPR: NON REAC (01/24 1405)  HBsAg: NEGATIVE (09/02 1114)  HIV: NONREACTIVE (01/24 1405)  GBS: Negative (03/15 0000)  1 hr Glucola 123 Genetic screening  Normal quad, NIPS low risk Anatomy US normal  Prenatal Transfer Tool  Maternal Diabetes: No Genetic Screening: Normal Maternal Ultrasounds/Referrals: Normal Fetal Ultrasounds or other Referrals:  None Maternal Substance Abuse:  No Significant Maternal Medications:  None Significant Maternal Lab Results: Lab values include: Group B Strep negative  No results found for this or any previous visit (from the past 24 hour(s)).  Patient  Active Problem List   Diagnosis Date Noted  . Post term pregnancy over 40 weeks 08/07/2015  . AMA (advanced maternal age) multigravida 35+ 02/23/2015  . High-risk pregnancy, multigravida of advanced maternal age, antepartum 12/31/2014  . H/O: cesarean section 12/31/2014  . Language barrier 12/31/2014    Assessment: Nancy Wright is a 40 y.o. G4P3003 at 5440w5d here for IOL. Will start pitocin then when favorable will place foly bulb.  #Labor:** #Pain: ** #FWB: ** #ID:  ** #MOF: ** #MOC:** #Circ:  Delynn Flavin**  Ashly Gottschalk, DO 08/07/2015, 7:39 AM

## 2015-08-07 NOTE — Anesthesia Preprocedure Evaluation (Signed)
Anesthesia Evaluation  Patient identified by MRN, date of birth, ID band Patient awake    Reviewed: Allergy & Precautions, NPO status , Patient's Chart, lab work & pertinent test results  Airway Mallampati: II  TM Distance: >3 FB Neck ROM: Full    Dental no notable dental hx.    Pulmonary neg pulmonary ROS,    Pulmonary exam normal breath sounds clear to auscultation       Cardiovascular negative cardio ROS Normal cardiovascular exam Rhythm:Regular Rate:Normal     Neuro/Psych negative neurological ROS  negative psych ROS   GI/Hepatic negative GI ROS, Neg liver ROS,   Endo/Other  negative endocrine ROS  Renal/GU negative Renal ROS     Musculoskeletal negative musculoskeletal ROS (+)   Abdominal   Peds  Hematology negative hematology ROS (+)   Anesthesia Other Findings   Reproductive/Obstetrics (+) Pregnancy                             Anesthesia Physical Anesthesia Plan  ASA: II  Anesthesia Plan: Epidural   Post-op Pain Management:    Induction:   Airway Management Planned:   Additional Equipment:   Intra-op Plan:   Post-operative Plan:   Informed Consent: I have reviewed the patients History and Physical, chart, labs and discussed the procedure including the risks, benefits and alternatives for the proposed anesthesia with the patient or authorized representative who has indicated his/her understanding and acceptance.     Plan Discussed with:   Anesthesia Plan Comments:         Anesthesia Quick Evaluation  

## 2015-08-07 NOTE — Progress Notes (Signed)
Alexa Liam Rogersoto is a 40 y.o. G4P3003 at 5573w5d by ultrasound admitted for induction of labor due to Post dates. Due date 08/01/15.  Subjective:   Objective: BP 100/57 mmHg  Pulse 84  Temp(Src) 99.1 F (37.3 C) (Axillary)  Resp 20  Ht 5\' 2"  (1.575 m)  Wt 131 lb (59.421 kg)  BMI 23.95 kg/m2  SpO2 100%      FHT:  FHR: 140 bpm, variability: moderate,  accelerations:  Present,  decelerations:  Absent UC:   regular, every 2-3 minutes SVE:   Dilation: 5.5 Effacement (%): 80 Station: -3 Exam by:: Henderson NewcomerStephanie Faulk, RN  Labs: Lab Results  Component Value Date   WBC 7.5 08/07/2015   HGB 13.6 08/07/2015   HCT 40.4 08/07/2015   MCV 78.9 08/07/2015   PLT 181 08/07/2015    Assessment / Plan: Induction of labor due to postterm,  progressing well on pitocin  Labor: Progressing on Pitocin, will continue to increase then AROM Preeclampsia:  no signs or symptoms of toxicity and intake and ouput balanced Fetal Wellbeing:  Category I Pain Control:  Nitrous Oxide I/D:  n/a Anticipated MOD:  NSVD  LAWSON, MARIE DARLENE 08/07/2015, 5:49 PM

## 2015-08-07 NOTE — Progress Notes (Signed)
Nancy Wright is a 40 y.o. G4P3003 at 352w5d by ultrasound admitted for induction of labor due to Post dates. Due date 08/01/15.  Subjective:   Objective: BP 113/64 mmHg  Pulse 86  Temp(Src) 98.4 F (36.9 C) (Oral)  Resp 18  Ht 5\' 2"  (1.575 m)  Wt 131 lb (59.421 kg)  BMI 23.95 kg/m2  SpO2 100%      FHT:  FHR: 130-140 bpm, variability: moderate,  accelerations:  Present,  decelerations:  Absent UC:   regular, every 3-4 minutes SVE:   Dilation: Closed Effacement (%): Thick Station: -3 Exam by:: Nancy Wright, CNM  Labs: Lab Results  Component Value Date   WBC 7.5 08/07/2015   HGB 13.6 08/07/2015   HCT 40.4 08/07/2015   MCV 78.9 08/07/2015   PLT 181 08/07/2015    Assessment / Plan: Induction of labor due to postterm,  progressing well on pitocin  Labor: Progressing on Pitocin, will continue to increase then AROM , foly bulb introduced thru cervix and inflated with 60cc sterile fluid. Preeclampsia:  no signs or symptoms of toxicity and intake and ouput balanced Fetal Wellbeing:  Category I Pain Control:  Labor support without medications I/D:  n/a Anticipated MOD:  NSVD  Nancy Wright Nancy Wright 08/07/2015, 1:53 PM

## 2015-08-07 NOTE — Progress Notes (Signed)
Subjective:  Nancy Wright is a 40 y.o. G4P3003 at 8063w5d being seen today for ongoing prenatal care.  She is currently monitored for the following issues for this high-risk pregnancy and has High-risk pregnancy, multigravida of advanced maternal age, antepartum; H/O: cesarean section; Language barrier; AMA (advanced maternal age) multigravida 35+; and Post term pregnancy over 40 weeks on her problem list.  Patient reports occasional contractions.  Contractions: Irregular. Vag. Bleeding: None.  Movement: Present. Denies leaking of fluid.   The following portions of the patient's history were reviewed and updated as appropriate: allergies, current medications, past family history, past medical history, past social history, past surgical history and problem list. Problem list updated.  Objective:   Filed Vitals:   08/03/15 1119  BP: 110/64  Pulse: 72  Temp: 98.5 F (36.9 C)  Weight: 131 lb (59.421 kg)    Fetal Status: Fetal Heart Rate (bpm): 150   Movement: Present    NST reactive  General:  Alert, oriented and cooperative. Patient is in no acute distress.  Skin: Skin is warm and dry. No rash noted.   Cardiovascular: Normal heart rate noted  Respiratory: Normal respiratory effort, no problems with respiration noted  Abdomen: Soft, gravid, appropriate for gestational age. Pain/Pressure: Present     Pelvic: Vag. Bleeding: None Vag D/C Character: White   Cervical exam deferred        Extremities: Normal range of motion.  Edema: None  Mental Status: Normal mood and affect. Normal behavior. Normal judgment and thought content.   Urinalysis: Urine Protein: Negative Urine Glucose: Negative  Assessment and Plan:  Pregnancy: G4P3003 at 9063w5d  1. High-risk pregnancy, multigravida of advanced maternal age, antepartum  NST 4/7 IOL 4/9 (No appt slots available at 41 weeks. [facility] will use available slot at 40.5 weeks)  Term labor symptoms and general obstetric precautions  including but not limited to vaginal bleeding, contractions, leaking of fluid and fetal movement were reviewed in detail with the patient. Please refer to After Visit Summary for other counseling recommendations.  Return in about 2 days (around 08/05/2015) for as scheduled 11am nst/afi.   Dorathy KinsmanVirginia Madyn Ivins, CNM

## 2015-08-07 NOTE — Patient Instructions (Signed)
Labor Induction Labor induction is when steps are taken to cause a pregnant woman to begin the labor process. Most women go into labor on their own between 37 weeks and 42 weeks of the pregnancy. When this does not happen or when there is a medical need, methods may be used to induce labor. Labor induction causes a pregnant woman's uterus to contract. It also causes the cervix to soften (ripen), open (dilate), and thin out (efface). Usually, labor is not induced before 39 weeks of the pregnancy unless there is a problem with the baby or mother.  Before inducing labor, your health care provider will consider a number of factors, including the following:  The medical condition of you and the baby.   How many weeks along you are.   The status of the baby's lung maturity.   The condition of the cervix.   The position of the baby.  WHAT ARE THE REASONS FOR LABOR INDUCTION? Labor may be induced for the following reasons:  The health of the baby or mother is at risk.   The pregnancy is overdue by 1 week or more.   The water breaks but labor does not start on its own.   The mother has a health condition or serious illness, such as high blood pressure, infection, placental abruption, or diabetes.  The amniotic fluid amounts are low around the baby.   The baby is distressed.  Convenience or wanting the baby to be born on a certain date is not a reason for inducing labor. WHAT METHODS ARE USED FOR LABOR INDUCTION? Several methods of labor induction may be used, such as:   Prostaglandin medicine. This medicine causes the cervix to dilate and ripen. The medicine will also start contractions. It can be taken by mouth or by inserting a suppository into the vagina.   Inserting a thin tube (catheter) with a balloon on the end into the vagina to dilate the cervix. Once inserted, the balloon is expanded with water, which causes the cervix to open.   Stripping the membranes. Your health  care provider separates amniotic sac tissue from the cervix, causing the cervix to be stretched and causing the release of a hormone called progesterone. This may cause the uterus to contract. It is often done during an office visit. You will be sent home to wait for the contractions to begin. You will then come in for an induction.   Breaking the water. Your health care provider makes a hole in the amniotic sac using a small instrument. Once the amniotic sac breaks, contractions should begin. This may still take hours to see an effect.   Medicine to trigger or strengthen contractions. This medicine is given through an IV access tube inserted into a vein in your arm.  All of the methods of induction, besides stripping the membranes, will be done in the hospital. Induction is done in the hospital so that you and the baby can be carefully monitored.  HOW LONG DOES IT TAKE FOR LABOR TO BE INDUCED? Some inductions can take up to 2-3 days. Depending on the cervix, it usually takes less time. It takes longer when you are induced early in the pregnancy or if this is your first pregnancy. If a mother is still pregnant and the induction has been going on for 2-3 days, either the mother will be sent home or a cesarean delivery will be needed. WHAT ARE THE RISKS ASSOCIATED WITH LABOR INDUCTION? Some of the risks of induction include:     Changes in fetal heart rate, such as too high, too low, or erratic.   Fetal distress.   Chance of infection for the mother and baby.   Increased chance of having a cesarean delivery.   Breaking off (abruption) of the placenta from the uterus (rare).   Uterine rupture (very rare).  When induction is needed for medical reasons, the benefits of induction may outweigh the risks. WHAT ARE SOME REASONS FOR NOT INDUCING LABOR? Labor induction should not be done if:   It is shown that your baby does not tolerate labor.   You have had previous surgeries on your  uterus, such as a myomectomy or the removal of fibroids.   Your placenta lies very low in the uterus and blocks the opening of the cervix (placenta previa).   Your baby is not in a head-down position.   The umbilical cord drops down into the birth canal in front of the baby. This could cut off the baby's blood and oxygen supply.   You have had a previous cesarean delivery.   There are unusual circumstances, such as the baby being extremely premature.    This information is not intended to replace advice given to you by your health care provider. Make sure you discuss any questions you have with your health care provider.   Document Released: 09/05/2006 Document Revised: 05/07/2014 Document Reviewed: 11/13/2012 Elsevier Interactive Patient Education 2016 Elsevier Inc.  

## 2015-08-07 NOTE — Anesthesia Procedure Notes (Addendum)
Epidural Patient location during procedure: OB  Staffing Anesthesiologist: Ade Stmarie Performed by: anesthesiologist   Preanesthetic Checklist Completed: patient identified, pre-op evaluation, timeout performed, IV checked, risks and benefits discussed and monitors and equipment checked  Epidural Patient position: sitting Prep: site prepped and draped and DuraPrep Patient monitoring: heart rate Approach: midline Location: L3-L4 Injection technique: LOR air and LOR saline  Needle:  Needle type: Tuohy  Needle gauge: 17 G Needle length: 9 cm Needle insertion depth: 5 cm Catheter type: closed end flexible Catheter size: 19 Gauge Catheter at skin depth: 11 cm Test dose: negative  Assessment Sensory level: T8 Events: blood not aspirated, injection not painful, no injection resistance, negative IV test and no paresthesia  Additional Notes Reason for block:procedure for pain   

## 2015-08-08 MED ORDER — OXYCODONE-ACETAMINOPHEN 5-325 MG PO TABS
1.0000 | ORAL_TABLET | ORAL | Status: DC | PRN
  Administered 2015-08-08 (×2): 2 via ORAL
  Administered 2015-08-09: 1 via ORAL
  Filled 2015-08-08: qty 2
  Filled 2015-08-08: qty 1
  Filled 2015-08-08: qty 2

## 2015-08-08 MED ORDER — MEASLES, MUMPS & RUBELLA VAC ~~LOC~~ INJ
0.5000 mL | INJECTION | Freq: Once | SUBCUTANEOUS | Status: DC
  Filled 2015-08-08: qty 0.5

## 2015-08-08 MED ORDER — IBUPROFEN 600 MG PO TABS
600.0000 mg | ORAL_TABLET | Freq: Four times a day (QID) | ORAL | Status: DC
  Administered 2015-08-08 – 2015-08-09 (×6): 600 mg via ORAL
  Filled 2015-08-08 (×6): qty 1

## 2015-08-08 MED ORDER — DIPHENHYDRAMINE HCL 25 MG PO CAPS: 25.0000 mg | ORAL_CAPSULE | Freq: Four times a day (QID) | ORAL | Status: DC | PRN

## 2015-08-08 MED ORDER — SODIUM CHLORIDE 0.9 % IV SOLN: 250.0000 mL | INTRAVENOUS | Status: DC | PRN

## 2015-08-08 MED ORDER — SODIUM CHLORIDE 0.9% FLUSH: 3.0000 mL | Freq: Two times a day (BID) | INTRAVENOUS | Status: DC

## 2015-08-08 MED ORDER — SODIUM CHLORIDE 0.9% FLUSH: 3.0000 mL | INTRAVENOUS | Status: DC | PRN

## 2015-08-08 MED ORDER — SENNOSIDES-DOCUSATE SODIUM 8.6-50 MG PO TABS
2.0000 | ORAL_TABLET | ORAL | Status: DC
Start: 2015-08-08 — End: 2015-08-09
  Administered 2015-08-08 – 2015-08-09 (×2): 2 via ORAL
  Filled 2015-08-08 (×2): qty 2

## 2015-08-08 MED ORDER — DIBUCAINE 1 % RE OINT: 1.0000 "application " | TOPICAL_OINTMENT | RECTAL | Status: DC | PRN

## 2015-08-08 MED ORDER — ACETAMINOPHEN 325 MG PO TABS: 650.0000 mg | ORAL_TABLET | ORAL | Status: DC | PRN

## 2015-08-08 MED ORDER — ONDANSETRON HCL 4 MG PO TABS: 4.0000 mg | ORAL_TABLET | ORAL | Status: DC | PRN

## 2015-08-08 MED ORDER — ONDANSETRON HCL 4 MG/2ML IJ SOLN: 4.0000 mg | INTRAMUSCULAR | Status: DC | PRN

## 2015-08-08 MED ORDER — PRENATAL MULTIVITAMIN CH
1.0000 | ORAL_TABLET | Freq: Every day | ORAL | Status: DC
  Administered 2015-08-08 – 2015-08-09 (×2): 1 via ORAL
  Filled 2015-08-08 (×2): qty 1

## 2015-08-08 MED ORDER — WITCH HAZEL-GLYCERIN EX PADS: 1.0000 "application " | MEDICATED_PAD | CUTANEOUS | Status: DC | PRN

## 2015-08-08 MED ORDER — TETANUS-DIPHTH-ACELL PERTUSSIS 5-2.5-18.5 LF-MCG/0.5 IM SUSP: 0.5000 mL | Freq: Once | INTRAMUSCULAR | Status: DC

## 2015-08-08 MED ORDER — ZOLPIDEM TARTRATE 5 MG PO TABS: 5.0000 mg | ORAL_TABLET | Freq: Every evening | ORAL | Status: DC | PRN

## 2015-08-08 MED ORDER — BENZOCAINE-MENTHOL 20-0.5 % EX AERO
1.0000 | INHALATION_SPRAY | CUTANEOUS | Status: DC | PRN
Start: 2015-08-08 — End: 2015-08-09
  Administered 2015-08-08: 1 via TOPICAL
  Filled 2015-08-08: qty 56

## 2015-08-08 MED ORDER — SIMETHICONE 80 MG PO CHEW: 80.0000 mg | CHEWABLE_TABLET | ORAL | Status: DC | PRN

## 2015-08-08 MED ORDER — LANOLIN HYDROUS EX OINT: TOPICAL_OINTMENT | CUTANEOUS | Status: DC | PRN

## 2015-08-08 NOTE — Progress Notes (Signed)
Post Partum Day 1 Subjective: no complaints, up ad lib, voiding and tolerating PO  Objective: Blood pressure 104/57, pulse 100, temperature 97.7 F (36.5 C), temperature source Oral, resp. rate 18, height 5\' 2"  (1.575 m), weight 131 lb (59.421 kg), SpO2 100 %, unknown if currently breastfeeding.  Physical Exam:  General: alert, cooperative, appears stated age and no distress Lochia: appropriate Uterine Fundus: firm Incision: n/a DVT Evaluation: No evidence of DVT seen on physical exam. Negative Homan's sign. No cords or calf tenderness. No significant calf/ankle edema.   Recent Labs  08/07/15 0740  HGB 13.6  HCT 40.4    Assessment/Plan: Plan for discharge tomorrow   LOS: 1 day   Nancy Wright 08/08/2015, 6:22 AM

## 2015-08-08 NOTE — Progress Notes (Signed)
UR chart review completed.  

## 2015-08-08 NOTE — Progress Notes (Signed)
[employer] language line used. Offered to order pt breakfast. She declined.

## 2015-08-08 NOTE — Lactation Note (Signed)
This note was copied from a baby's chart. Lactation Consultation Note Experienced mom BF her other 3 children for 1 1/2 yrs. Moms youngest child is 39 yrs old. FOB speaks some English and interpret for mom. If extensive teaching needs to be done, an interpreter is needed. States BF going very well and denies pain. Asked mom is she sees any thing coming out of her breast and she stated clear watery stuff per FOB. Explained that was colostrum and it was good. Fob stated baby has been sleepy and fed a couple of times. Asked to call for assistance next feeding to see baby BF. FOB Stated OK, it should be soon baby will be hungry. Has WIC. Left brochure of resources and LC OP services. Patient Name: Nancy Wright Today's Date: 08/08/2015 Reason for consult: Initial assessment   Maternal Data Has patient been taught Hand Expression?: Yes Does the patient have breastfeeding experience prior to this delivery?: Yes  Feeding    LATCH Score/Interventions                      Lactation Tools Discussed/Used WIC Program: Yes   Consult Status Consult Status: Follow-up Date: 08/08/15 Follow-up type: In-patient    Charyl DancerCARVER, Ondra Deboard G 08/08/2015, 6:42 AM

## 2015-08-08 NOTE — Anesthesia Postprocedure Evaluation (Signed)
Anesthesia Post Note  Patient: Nancy Wright  Procedure(s) Performed: * No procedures listed *  Patient location during evaluation: Mother Baby Anesthesia Type: Epidural Level of consciousness: awake and alert and oriented Pain management: satisfactory to patient Vital Signs Assessment: post-procedure vital signs reviewed and stable Respiratory status: spontaneous breathing and nonlabored ventilation Cardiovascular status: stable Postop Assessment: no headache, no backache, no signs of nausea or vomiting, adequate PO intake and patient able to bend at knees (patient up walking) Anesthetic complications: no    Last Vitals:  Filed Vitals:   08/08/15 0100 08/08/15 0500  BP: 104/57 91/56  Pulse: 100 69  Temp: 36.5 C 36.8 C  Resp: 18 18    Last Pain:  Filed Vitals:   08/08/15 0657  PainSc: 2                  Driana Dazey

## 2015-08-09 MED ORDER — IBUPROFEN 600 MG PO TABS: 600.0000 mg | ORAL_TABLET | Freq: Four times a day (QID) | ORAL | Status: DC

## 2015-08-09 NOTE — Discharge Instructions (Signed)

## 2015-08-09 NOTE — Discharge Summary (Signed)
OB Discharge Summary  Patient Name: Nancy Wright DOB: 09/03/1975 MRN: [id number]  Date of admission: 08/07/2015 Delivering MD: Wyvonnia DuskyLAWSON, MARIE D   Date of discharge: 08/09/2015  Admitting diagnosis: 40WKS INDUCTION Intrauterine pregnancy: 1936w5d     Secondary diagnosis:Active Problems:   Post term pregnancy over 40 weeks  Additional problems: None     Discharge diagnosis: Vacuum-assisted VBAC                                                                     Post partum procedures:none  Augmentation: AROM and Pitocin  Complications: None  Hospital course:  Induction of Labor With Vaginal Delivery   40 y.o. yo [id number] at 836w5d was admitted to the hospital 08/07/2015 for induction of labor.  Indication for induction: Postdates.  Patient had an uncomplicated labor course as follows: Membrane Rupture Time/Date: 3:00 PM ,08/07/2015   Intrapartum Procedures: Episiotomy: None [1]                                         Lacerations:  2nd degree [3];Perineal [11]  Patient had delivery of a Viable infant.  Information for the patient's newborn:  Liam Rogersoto, Girl Freddi [[id number]][[id number]]  Delivery Method: VBAC, Vacuum Assisted (Filed from Delivery Summary)    08/07/2015  Details of delivery can be found in separate delivery note.  Patient had a routine postpartum course. Patient is discharged home 08/09/2015.   Physical exam  Filed Vitals:   08/08/15 0500 08/08/15 1134 08/08/15 1745 08/09/15 0512  BP: 91/56 103/58 97/51 97/46   Pulse: 69 68 82 74  Temp: 98.2 F (36.8 C) 97.7 F (36.5 C) 98.7 F (37.1 C) 98.4 F (36.9 C)  TempSrc: Oral Oral Oral Oral  Resp: 18 18 18 16   Height:      Weight:      SpO2:  100%     General: alert, cooperative and no distress Lochia: appropriate Uterine Fundus: firm Incision: N/A DVT Evaluation: No evidence of DVT seen on physical exam. Labs: Lab Results  Component Value Date   WBC 7.5 08/07/2015   HGB 13.6 08/07/2015   HCT 40.4 08/07/2015   MCV 78.9  08/07/2015   PLT 181 08/07/2015   CMP Latest Ref Rng 12/07/2014  Glucose 65 - 99 mg/dL 65  BUN 6 - 20 mg/dL 4(L)  Creatinine 1.300.44 - 1.00 mg/dL 8.65(H0.40(L)  Sodium 846135 - 962145 mmol/L 139  Potassium 3.5 - 5.1 mmol/L 3.4(L)  Chloride 101 - 111 mmol/L 102    Discharge instruction: per After Visit Summary and "Baby and Me Booklet".  After Visit Meds:    Medication List    TAKE these medications        ibuprofen 600 MG tablet  Commonly known as:  ADVIL,MOTRIN  Take 1 tablet (600 mg total) by mouth every 6 (six) hours.     PRENATAL COMPLETE 14-0.4 MG Tabs  Take 1 tablet by mouth daily.        Diet: routine diet  Activity: Advance as tolerated. Pelvic rest for 6 weeks.   Outpatient follow up:6 weeks Follow up Appt: Future Appointments Date Time Provider Department Center  09/13/2015 2:20 PM Eino Farber Kennith Gain, CNM [facility]   Follow up visit: No Follow-up on file.  Postpartum contraception: Depo Provera  Newborn Data: Live born female  Birth Weight: 5 lb 11.4 oz (2590 g) APGAR: 7, 9  Baby Feeding: Breast Disposition:home with mother   08/09/2015 Leeroy Cha, CNM

## 2015-08-09 NOTE — Discharge Summary (Signed)
OB Discharge Summary  Patient Name: Nancy Wright DOB: December 06, 1975 MRN: [id number]  Date of admission: 08/07/2015 Delivering MD: Wyvonnia Dusky D   Date of discharge: 08/09/2015  Admitting diagnosis: 40WKS INDUCTION Intrauterine pregnancy: [redacted]w[redacted]d     Secondary diagnosis:Active Problems:   Post term pregnancy over 40 weeks  Additional problems: None     Discharge diagnosis: Vacuum-assisted VBAC                                                                     Post partum procedures:none  Augmentation: AROM and Pitocin  Complications: None  Hospital course:  Induction of Labor With Vaginal Delivery   40 y.o. yo [id number] at [redacted]w[redacted]d was admitted to the hospital 08/07/2015 for induction of labor.  Indication for induction: Postdates.  Patient had an uncomplicated labor course as follows: Membrane Rupture Time/Date: 3:00 PM ,08/07/2015   Intrapartum Procedures: Episiotomy: None [1]                                         Lacerations:  2nd degree [3];Perineal [11]  Patient had delivery of a Viable infant.  Information for the patient's newborn:  Keoni, Havey Girl Dalyla [[id number]]  Delivery Method: VBAC, Vacuum Assisted (Filed from Delivery Summary)   08/07/2015  Details of delivery can be found in separate delivery note.  Patient had a routine postpartum course. Patient is discharged home 08/09/2015.   Physical exam  Filed Vitals:   08/08/15 0500 08/08/15 1134 08/08/15 1745 08/09/15 0512  BP: 91/56 103/58 97/51 97/46   Pulse: 69 68 82 74  Temp: 98.2 F (36.8 C) 97.7 F (36.5 C) 98.7 F (37.1 C) 98.4 F (36.9 C)  TempSrc: Oral Oral Oral Oral  Resp: Height:      Weight:      SpO2:  100%     General: alert, cooperative and no distress Lochia: appropriate Uterine Fundus: firm Incision: N/A DVT Evaluation: No evidence of DVT seen on physical exam. Labs: Lab Results  Component Value Date   WBC 7.5 08/07/2015   HGB 13.6 08/07/2015   HCT 40.4 08/07/2015   MCV 78.9  08/07/2015   PLT 181 08/07/2015   CMP Latest Ref Rng 12/07/2014  Glucose 65 - 99 mg/dL 65  BUN 6 - 20 mg/dL 4(L)  Creatinine 4.40 - 1.00 mg/dL 3.47(Q)  Sodium 259 - 563 mmol/L 139  Potassium 3.5 - 5.1 mmol/L 3.4(L)  Chloride 101 - 111 mmol/L 102    Discharge instruction: per After Visit Summary and "Baby and Me Booklet".  After Visit Meds:    Medication List    TAKE these medications        ibuprofen 600 MG tablet  Commonly known as:  ADVIL,MOTRIN  Take 1 tablet (600 mg total) by mouth every 6 (six) hours.     PRENATAL COMPLETE 14-0.4 MG Tabs  Take 1 tablet by mouth daily.        Diet: routine diet  Activity: Advance as tolerated. Pelvic rest for 6 weeks.   Outpatient follow up:6 weeks Follow up Appt:Future Appointments Date Time Provider Department Center  09/13/2015 2:20  PM Eino FarberWalidah Kennith GainN Karim, CNM [facility]   Follow up visit: No Follow-up on file.  Postpartum contraception: Depo Provera  Newborn Data: Live born female  Birth Weight: 5 lb 11.4 oz (2590 g) APGAR: 7, 9  Baby Feeding: Breast Disposition:home with mother   08/09/2015 Hilton SinclairKaty D Mayo, MD  CNM attestation:  I have seen and examined this patient; I agree with above documentation in the Resident's note.    Song Garris Grissett, CNM 10:05 AM

## 2015-08-09 NOTE — Lactation Note (Signed)
This note was copied from a baby's chart. Lactation Consultation Note  Patient Name: Nancy Wright Today's Date: 08/09/2015 Reason for consult: Follow-up assessment;Infant < 6lbs  Baby 38 hours old. [facility] "Ahmed" [phone]. Mom reports that baby is nursing well. Mom states that she had a good breast milk supply with each of her older children. Mom reports that baby is latching well and she is hearing swallows at breast. Baby has dried EBM around the mouth. Mom had question about baby being "spitty." Enc mom to hold baby upright on chest/shoulder 15 - 20 minutes after each feeding to let milk settle. Mom aware of OP/BFSG and LC phone line assistance after D/C.  Maternal Data    Feeding    LATCH Score/Interventions                      Lactation Tools Discussed/Used     Consult Status Consult Status: PRN    Geralynn OchsWILLIARD, Karolyna Bianchini 08/09/2015, 11:35 AM

## 2015-08-20 NOTE — Progress Notes (Signed)
NST reactive on 08/05/15 

## 2015-09-13 ENCOUNTER — Ambulatory Visit (INDEPENDENT_AMBULATORY_CARE_PROVIDER_SITE_OTHER): Payer: Medicaid Other | Admitting: Family

## 2015-09-13 ENCOUNTER — Encounter: Payer: Self-pay | Admitting: Family

## 2015-09-13 VITALS — BP 107/69 | HR 69 | Wt 144.1 lb

## 2015-09-13 DIAGNOSIS — Z308 Encounter for other contraceptive management: Secondary | ICD-10-CM

## 2015-09-13 DIAGNOSIS — Z30017 Encounter for initial prescription of implantable subdermal contraceptive: Secondary | ICD-10-CM | POA: Diagnosis not present

## 2015-09-13 DIAGNOSIS — Z98891 History of uterine scar from previous surgery: Secondary | ICD-10-CM

## 2015-09-13 LAB — POCT URINALYSIS DIP (DEVICE)
BILIRUBIN URINE: NEGATIVE
GLUCOSE, UA: NEGATIVE mg/dL
NITRITE: NEGATIVE
Protein, ur: NEGATIVE mg/dL
Specific Gravity, Urine: 1.015 (ref 1.005–1.030)
UROBILINOGEN UA: 0.2 mg/dL (ref 0.0–1.0)
pH: 5.5 (ref 5.0–8.0)

## 2015-09-13 MED ORDER — ETONOGESTREL 68 MG ~~LOC~~ IMPL
68.0000 mg | DRUG_IMPLANT | Freq: Once | SUBCUTANEOUS | Status: AC
  Administered 2015-09-13: 68 mg via SUBCUTANEOUS

## 2015-09-13 NOTE — Addendum Note (Signed)
Addended by: Jill SideAY, DIANE L on: 09/13/2015 04:20 PM   Modules accepted: Orders

## 2015-09-13 NOTE — Progress Notes (Signed)
Patient ID: Nancy Wright, female   DOB: 07/25/1975, 40 y.o.   MRN: [id number] Subjective:     Nancy Wright is a 40 y.o. female who presents for a postpartum visit. She is six weeks postpartum following a delivery on 08/07/2015. I have fully reviewed the prenatal and intrapartum course. The delivery was at [redacted] weeks gestational weeks. Outcome: vaginal. Anesthesia: Epidural. Postpartum course has been uncomplicated. Baby's course has been uncomplicated. Baby is feeding by breast. She is currently bleeding at this time. Bowel function is normal. Bladder function is normal. Patient is sexually active. Contraception method is Nexplanon insertion.. Postpartum depression screening: negative.  The following portions of the patient's history were reviewed and updated as appropriate: allergies, current medications, past family history, past medical history, past social history, past surgical history and problem list.  Review of Systems Pertinent items are noted in HPI.   Objective:    There were no vitals taken for this visit.        General:  alert, cooperative and appears stated age   Breasts:  inspection negative, no nipple discharge or bleeding, no masses or nodularity palpable  Lungs: clear to auscultation bilaterally  Heart:  regular rate and rhythm, S1, S2 normal, no murmur, click, rub or gallop  Abdomen: soft, non-tender; bowel sounds normal; no masses,  no organomegaly  Pelvic exam not indicated - not bleeding, no pain at vaginal area  NEXPLANON INSERTION: Appropriate time out taken. Nexlanon site (left arm) identified and the area was prepped in usual sterile fashon. 2 cc of 1% lidocaine was used to anesthetize the area starting with the distal end.   Next, the area was cleansed with betadine and the Nexplanon was inserted without difficulty.  Pressure bandage was applied.  Pt was instructed to remove pressure bandage in a few hours, and keep insertion site covered with a bandaid for 3 days.   Assessment:     Normal postpartum exam. Pap smear not done at today's visit.   Plan:    1. Contraception: Nexplanon; potential side effects reviewed again prior to discharge 2. Pap smear in one year 3. Follow up in: 1 year or as needed.    Eino FarberWalidah Kennith GainN Karim, CNM

## 2017-02-10 IMAGING — US US MFM FETAL NUCHAL TRANSLUCENCY
1 series · 15 of 28 positions shown · non-contrast
Comparison: none

[Series 1: us mfm fetal nuchal translucency · 15 of 37 slices shown]
[im 1/37]
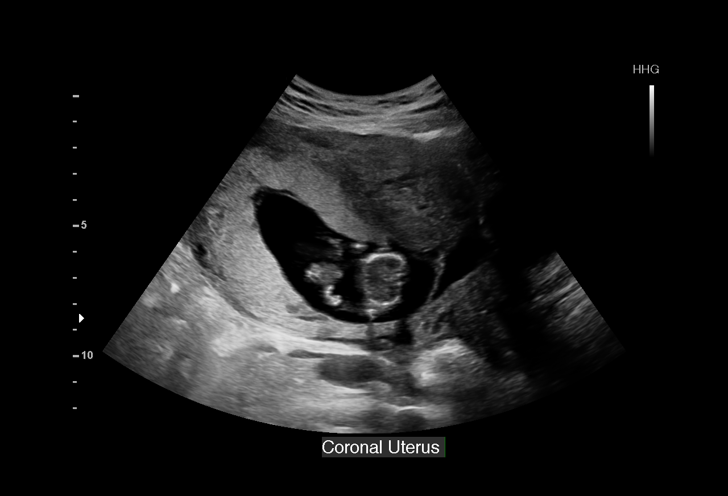
[im 3/37]
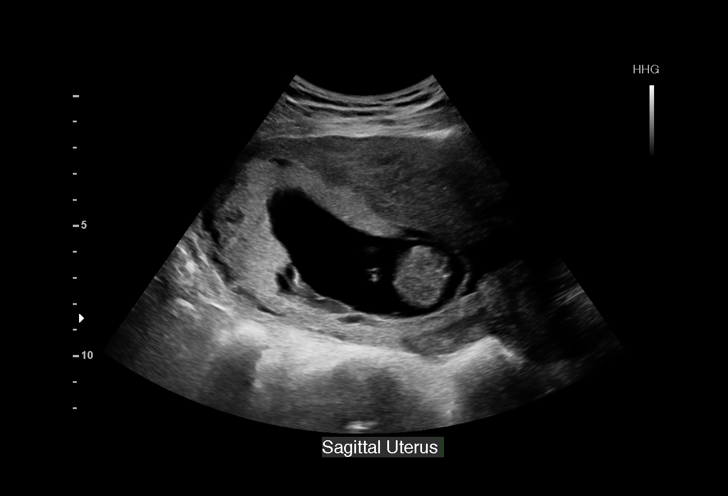
[im 6/37]
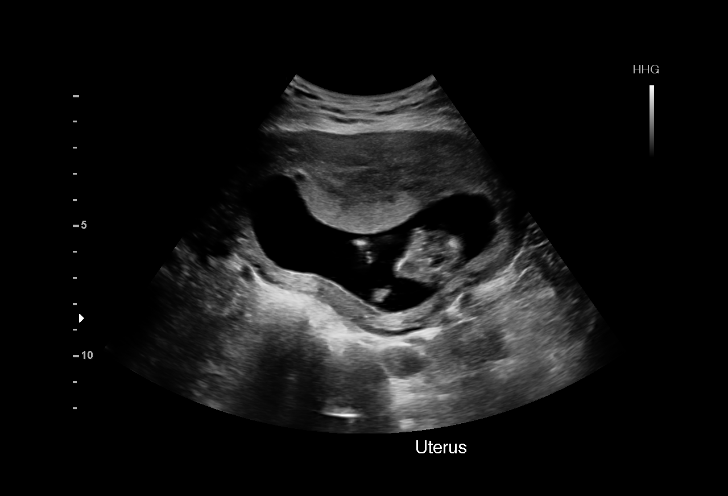
[im 9/37]
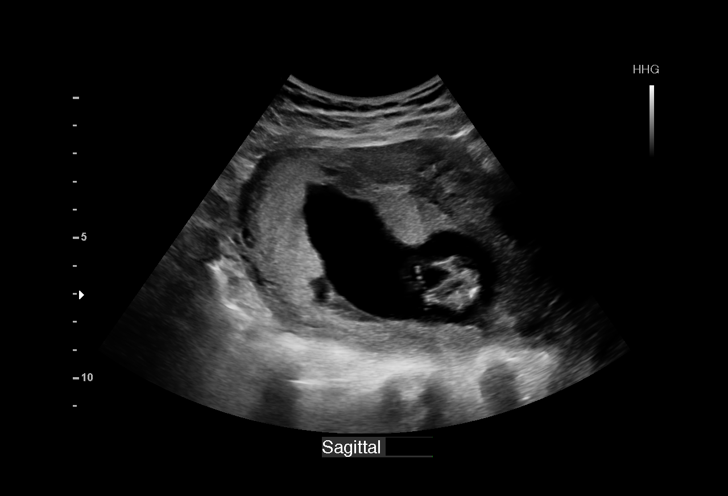
[im 11/37]
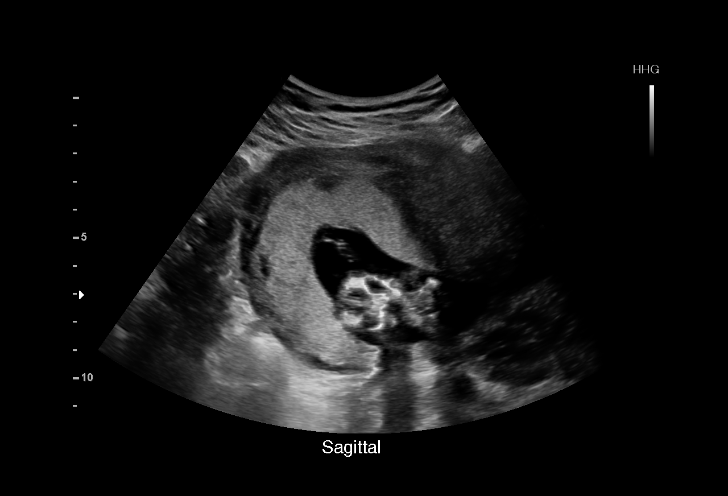
[im 14/37]
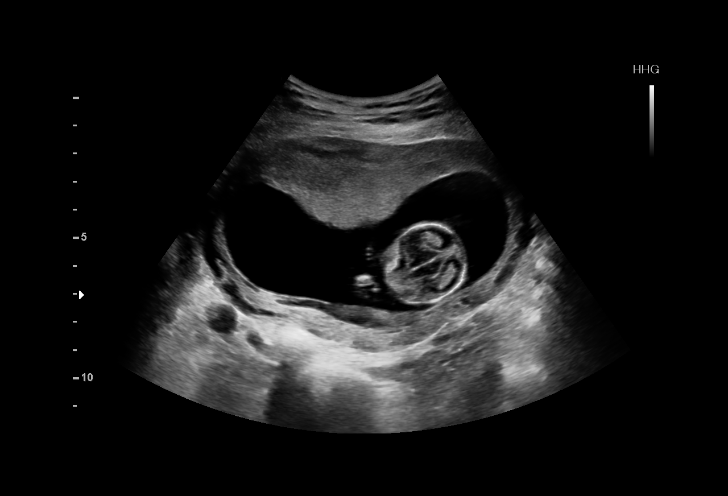
[im 17/37]
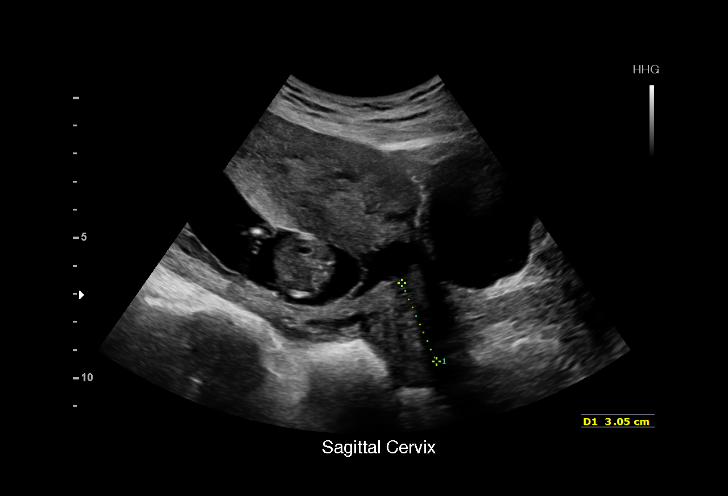
[im 19/37]
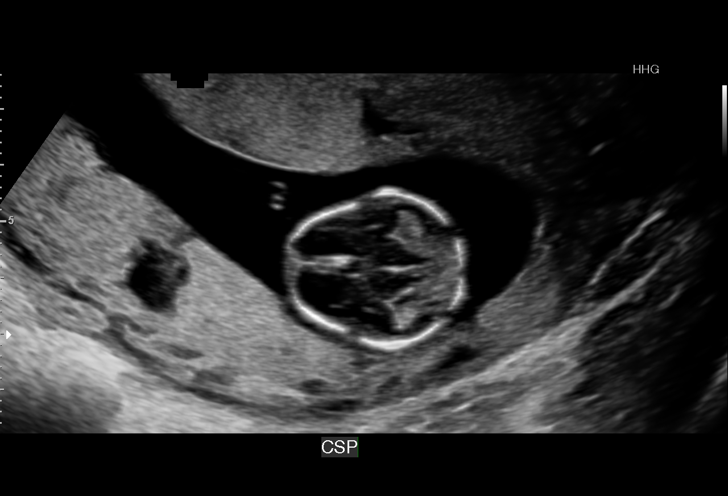
[im 21/37]
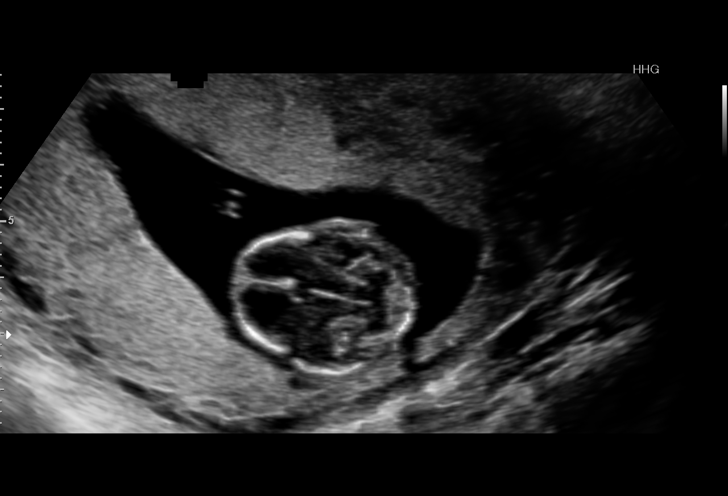
[im 23/37]
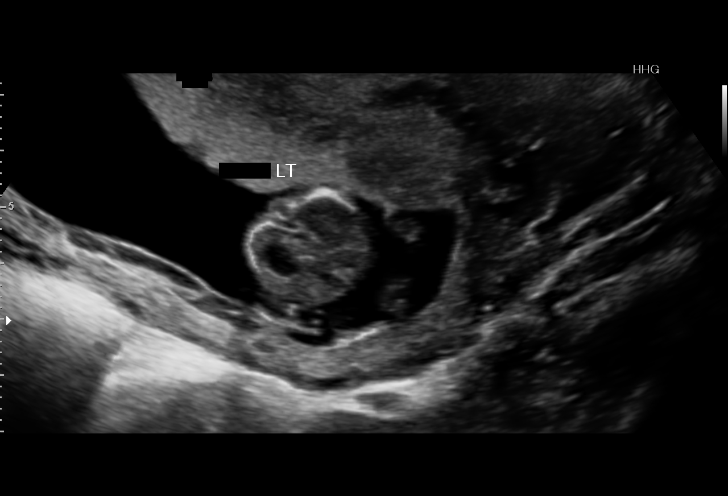
[im 26/37]
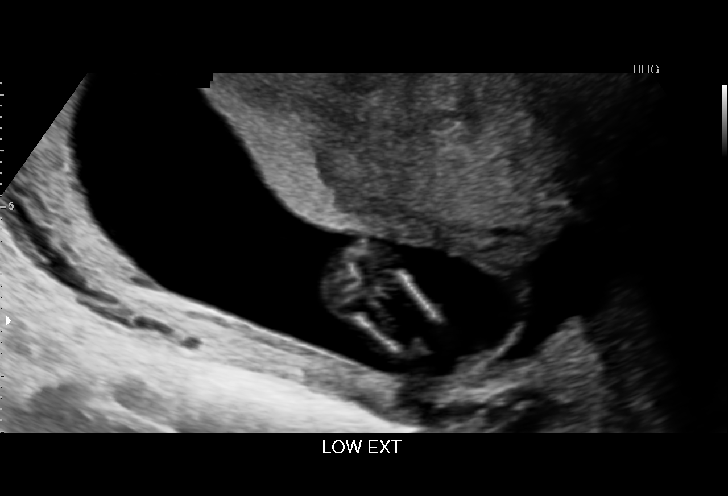
[im 29/37]
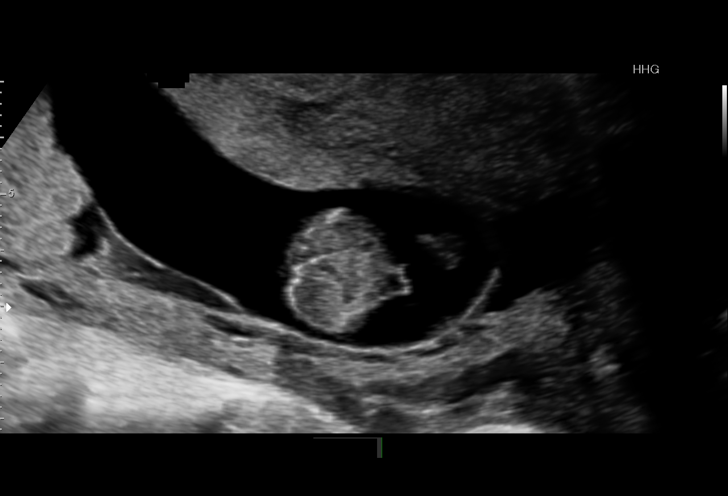
[im 31/37]
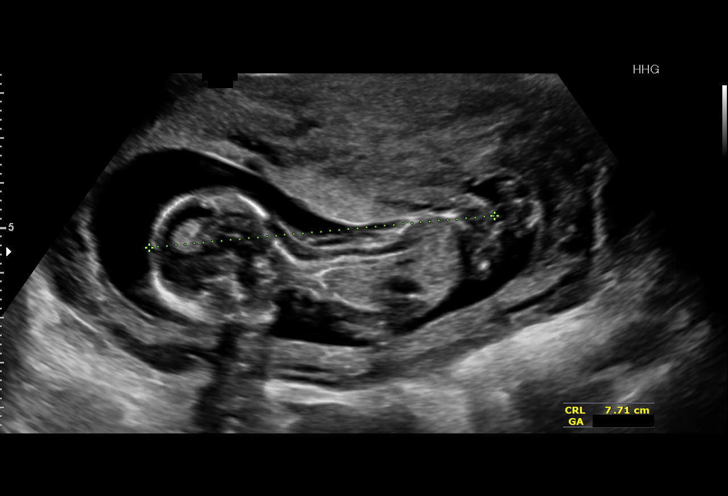
[im 34/37]
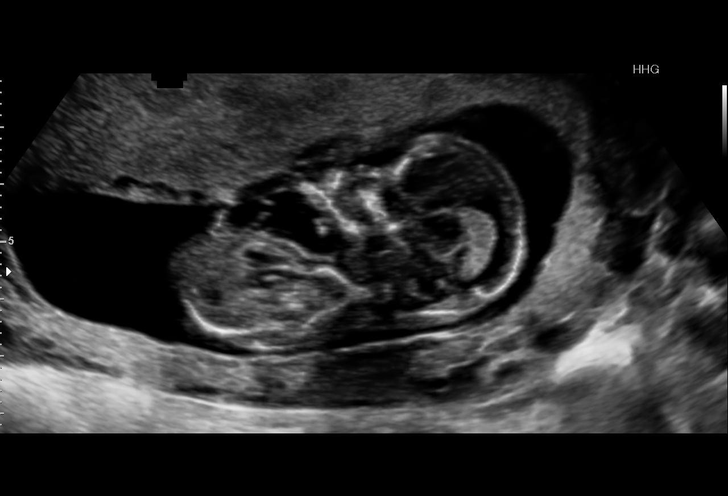
[im 37/37]
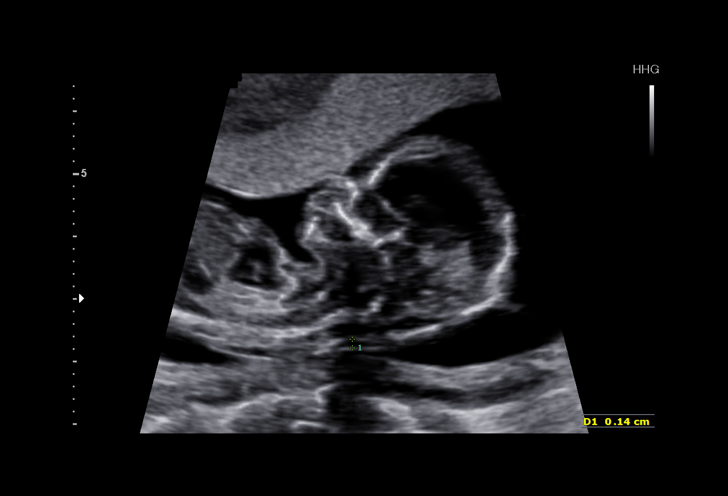

[15 of 28 positions shown; findings below may reference images not displayed]

OBSTETRICS REPORT
(Signed Final 01/28/2015 [DATE])

Name:       RUI VIEIRA MOTOC                            Visit  01/28/2015 [DATE]
Date:

Service(s) Provided

Indications

13 weeks gestation of pregnancy
Advanced maternal age multigravida (39), first
trimester
First trimester aneuploidy screen (NT)                 Z36
Fetal Evaluation

Num Of             1
Fetuses:
Fetal Heart        154                          bpm
Rate:
Cardiac Activity:  Observed
Presentation:      Variable
Placenta:          Anterior Fundal, above
cervical os
P. Cord            Visualized
Insertion:

Amniotic Fluid
AFI FV:      Subjectively within normal limits
Larg Pckt:      3.6  cm
Biometry

CRL:     77.1   m    G. Age:   13w 3d                  EDD:   08/02/15
m

NT:        1.4  m
m
Gestational Age

LMP:           13w 3d        Date:  10/26/14                  EDD:   08/02/15
Best:          13w 3d    Det. By:   LMP  (10/26/14)           EDD:   08/02/15
Anatomy
Stomach:          Appears normal,        Bladder:           Appears normal
left sided
Cervix Uterus Adnexa

Cervical Length:    3.1       cm

Cervix:       Normal appearance by transabdominal scan.
Impression

Single IUP at 13w 3d
Advanced maternal age
Normal NT (1.4 mm).  Nasal bone visualized.
After counseling, the patient declined further genetic testing.
Recommendations

Recommend ultrasound for fetal anatomy at 18-20 weeks
(scheduled)

questions or concerns.

## 2017-05-06 ENCOUNTER — Ambulatory Visit (INDEPENDENT_AMBULATORY_CARE_PROVIDER_SITE_OTHER): Payer: Self-pay | Admitting: General Practice

## 2017-05-06 DIAGNOSIS — Z3202 Encounter for pregnancy test, result negative: Secondary | ICD-10-CM

## 2017-05-06 LAB — POCT PREGNANCY, URINE: Preg Test, Ur: NEGATIVE

## 2017-05-06 NOTE — Progress Notes (Signed)
Patient came into office today reporting heavy bleeding/prolonged periods from the beginning of October until January 3rd. Patient states she has been having irregular bleeding like this for the past 8 months. Patient has nexplanon in place. Discussed with patient this is a normal side effect with nexplanon. Told patient she may call us when this bleeding happens and usually we can send in a Rx for OCPs for a month to stop and control bleeding. Told patient if she isn't happy with that option, she should return to see a provider for a different birth control option. Patient wishes for appt to change birth control. Patient taken to front office for appt and had no other questions.

## 2017-06-12 ENCOUNTER — Ambulatory Visit: Payer: Medicaid Other | Admitting: Advanced Practice Midwife

## 2017-11-06 ENCOUNTER — Ambulatory Visit: Payer: Medicaid Other | Admitting: Obstetrics & Gynecology

## 2017-11-07 ENCOUNTER — Encounter: Payer: Self-pay | Admitting: Obstetrics & Gynecology

## 2017-11-07 ENCOUNTER — Other Ambulatory Visit (HOSPITAL_COMMUNITY)
Admission: RE | Admit: 2017-11-07 | Discharge: 2017-11-07 | Disposition: A | Discharge status: Home / Self Care | Payer: Medicaid Other | Source: Ambulatory Visit | Attending: Obstetrics & Gynecology | Admitting: Obstetrics & Gynecology

## 2017-11-07 ENCOUNTER — Ambulatory Visit (INDEPENDENT_AMBULATORY_CARE_PROVIDER_SITE_OTHER): Payer: Medicaid Other | Admitting: Obstetrics & Gynecology

## 2017-11-07 VITALS — BP 126/70 | HR 76 | Wt 127.0 lb

## 2017-11-07 DIAGNOSIS — B9689 Other specified bacterial agents as the cause of diseases classified elsewhere: Secondary | ICD-10-CM | POA: Diagnosis not present

## 2017-11-07 DIAGNOSIS — Z0000 Encounter for general adult medical examination without abnormal findings: Secondary | ICD-10-CM

## 2017-11-07 DIAGNOSIS — Z01419 Encounter for gynecological examination (general) (routine) without abnormal findings: Secondary | ICD-10-CM

## 2017-11-07 DIAGNOSIS — Z124 Encounter for screening for malignant neoplasm of cervix: Secondary | ICD-10-CM

## 2017-11-07 DIAGNOSIS — Z975 Presence of (intrauterine) contraceptive device: Principal | ICD-10-CM

## 2017-11-07 DIAGNOSIS — Z3009 Encounter for other general counseling and advice on contraception: Secondary | ICD-10-CM

## 2017-11-07 DIAGNOSIS — N921 Excessive and frequent menstruation with irregular cycle: Secondary | ICD-10-CM

## 2017-11-07 MED ORDER — NORGESTIMATE-ETH ESTRADIOL 0.25-35 MG-MCG PO TABS: 1.0000 | ORAL_TABLET | Freq: Every day | ORAL | 3 refills | Status: DC

## 2017-11-07 MED ORDER — NAPROXEN 500 MG PO TABS: 500.0000 mg | ORAL_TABLET | Freq: Two times a day (BID) | ORAL | 0 refills | Status: AC

## 2017-11-07 MED FILL — NAPROXEN 500 MG TABLET: 500 | 7 days supply | Qty: 14 | Fill #0

## 2017-11-07 MED FILL — FEMYNOR 0.25-35 MG-MCG TABS: 0.25-35 | 28 days supply | Qty: 28 | Fill #0

## 2017-11-07 NOTE — Progress Notes (Addendum)
GYNECOLOGY OFFICE VISIT NOTE  History:  42 y.o. [id number] here today for discussion of AUB in the setting of Nexplanon being in place; wants to discuss this contraception modality and possible alternatives. Also needs annual exam and pap smear.  Due to language barrier, a Arabic interpreter was present during the history-taking and subsequent discussion (and for part of the physical exam) with this patient.  Nexplanon placed in 08/2015. No complications until 01/2017 when she started having irregular heavy bleeding.  Reports she has been bleeding every week for the past four months.  Associated with lightheadedness and dizziness, no syncopal episodes. No abdominal pain.  She denies any abnormal vaginal discharge, pelvic pain or other concerns.   Past Medical History:  Diagnosis Date  . Medical history non-contributory     Past Surgical History:  Procedure Laterality Date  . CESAREAN SECTION      The following portions of the patient's history were reviewed and updated as appropriate: allergies, current medications, past family history, past medical history, past social history, past surgical history and problem list.   Health Maintenance:  Normal pap but positive HRHPV on 12/31/2014, no subsequent pap.  Never had a mammogram.   Review of Systems:  Pertinent items noted in HPI and remainder of comprehensive ROS otherwise negative.  Objective:  Physical Exam BP 126/70   Pulse 76   Wt 127 lb (57.6 kg)   LMP 07/15/2017 (Approximate) Comment: Every Week   BMI 23.23 kg/m  CONSTITUTIONAL: Well-developed, well-nourished female in no acute distress.  HENT:  Normocephalic, atraumatic, External right and left ear normal. Oropharynx is clear and moist EYES: Conjunctivae and EOM are normal. Pupils are equal, round, and reactive to light. No scleral icterus.  NECK: Normal range of motion, supple, no masses.  Normal thyroid.  SKIN: Skin is warm and dry. No rash noted. Not diaphoretic. No erythema.  No pallor. NEUROLOGIC: Alert and oriented to person, place, and time. Normal reflexes, muscle tone coordination. No cranial nerve deficit noted. PSYCHIATRIC: Normal mood and affect. Normal behavior. Normal judgment and thought content. CARDIOVASCULAR: Normal heart rate noted RESPIRATORY: Effort and breath sounds normal, no problems with respiration noted. BREASTS: Symmetric in size. No masses, skin changes, nipple drainage, or lymphadenopathy. ABDOMEN: Soft, normal bowel sounds, no distention noted.  No tenderness, rebound or guarding.  PELVIC: Normal appearing external genitalia; normal appearing vaginal mucosa and cervix.  Pink discharge noted, no active bleeding.  Pap smear obtained; some cervical bleeding after Pap.  Normal uterine size, no other palpable masses, no uterine or adnexal tenderness. MUSCULOSKELETAL: Normal range of motion. No tenderness.  No cyanosis, clubbing, or edema.  2+ distal pulses.   Assessment & Plan:  1. Breakthrough bleeding on Nexplanon 2. General counseling and advice for contraceptive management It is likely secondary to Nexplanon but will also do other AUB evaluation given late onset of irregular bleeding with Nexplanon (occurred 15 months after placement).  Ultrasound and labs ordered; pap also done. Concerned about anemia given symptoms.  AUB to be treated with Naproxen course and OCPs. Will reevaluate in one month.  Patient wants to continue with this modality for contraception for now: reviewed all forms of birth control options available including abstinence; fertility period awareness methods; over the counter/barrier methods; hormonal contraceptive medication including pill, patch, ring, injection,contraceptive implant; hormonal and nonhormonal IUDs; permanent sterilization options including vasectomy and the various tubal sterilization modalities were discussed with patient.  Will keep Nexplanon in place for now pending further evaluation of this BTB  and will  see response to managements with Naproxen and OCPs. - CBC - TSH - Beta hCG quant (ref lab) - Cytology - PAP - US PELVIC COMPLETE WITH TRANSVAGINAL; Future - norgestimate-ethinyl estradiol (ORTHO-CYCLEN,SPRINTEC,PREVIFEM) 0.25-35 MG-MCG tablet; Take 1 tablet by mouth daily.  Dispense: 1 Package; Refill: 3 - naproxen (NAPROSYN) 500 MG tablet; Take 1 tablet (500 mg total) by mouth 2 (two) times daily with a meal for 7 days. As needed for pain  Dispense: 14 tablet; Refill: 0  3. Pap smear for cervical cancer screening 4. 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 Otherwise, normal annual exam. - Cytology - PAP Routine preventative health maintenance measures emphasized, mammogram application given to patient.   Please refer to After Visit Summary for other counseling recommendations.   Return in about 1 month (around 12/08/2017) for Follow up AUB with Nexplanon.   Total face-to-face time with patient: 30 minutes.  Over 50% of encounter was spent on counseling and coordination of care.    Nancy Wright  Sheilla Maris, MD, FACOG Obstetrician & Gynecologist, [facility], [facility]

## 2017-11-07 NOTE — Addendum Note (Signed)
Addended by: Jaynie CollinsANYANWU, UGONNA A on: 11/07/2017 01:25 PM   Modules accepted: Level of Service

## 2017-11-07 NOTE — Patient Instructions (Signed)

## 2017-11-08 LAB — CBC
HEMATOCRIT: 36.2 % (ref 34.0–46.6)
HEMOGLOBIN: 12 g/dL (ref 11.1–15.9)
MCH: 27.2 pg (ref 26.6–33.0)
MCHC: 33.1 g/dL (ref 31.5–35.7)
MCV: 82 fL (ref 79–97)
Platelets: 271 10*3/uL (ref 150–450)
RBC: 4.41 x10E6/uL (ref 3.77–5.28)
RDW: 13.6 % (ref 12.3–15.4)
WBC: 4.6 10*3/uL (ref 3.4–10.8)

## 2017-11-08 LAB — CYTOLOGY - PAP
BACTERIAL VAGINITIS: POSITIVE — AB
Candida vaginitis: NEGATIVE
Chlamydia: NEGATIVE
DIAGNOSIS: NEGATIVE
HPV: NOT DETECTED
NEISSERIA GONORRHEA: NEGATIVE
Trichomonas: NEGATIVE

## 2017-11-08 LAB — TSH: TSH: 3.12 u[IU]/mL (ref 0.450–4.500)

## 2017-11-08 LAB — BETA HCG QUANT (REF LAB)

## 2017-11-09 MED ORDER — METRONIDAZOLE 500 MG PO TABS: 500.0000 mg | ORAL_TABLET | Freq: Two times a day (BID) | ORAL | 0 refills | Status: DC

## 2017-11-09 NOTE — Addendum Note (Signed)
Addended by: Jaynie CollinsANYANWU, Jaydon Avina A on: 11/09/2017 02:12 PM   Modules accepted: Orders

## 2017-11-11 ENCOUNTER — Other Ambulatory Visit: Payer: Self-pay | Admitting: Obstetrics & Gynecology

## 2017-11-11 ENCOUNTER — Telehealth: Payer: Self-pay

## 2017-11-11 DIAGNOSIS — Z1231 Encounter for screening mammogram for malignant neoplasm of breast: Secondary | ICD-10-CM

## 2017-11-11 NOTE — Telephone Encounter (Signed)
-----   Message from Nancy NewcomerUgonna A Anyanwu, MD sent at 11/09/2017  2:12 PM EDT ----- Pap was negative for cervical cancer screening but showed bacterial vaginitis.   Metronidazole was prescribed. Please inform patient of results and advise to pick up prescription. Speaks Arabic.

## 2017-11-11 NOTE — Telephone Encounter (Signed)
Call patient to inform her of test results for BV. No answer or voice mail to leave a message.

## 2017-11-13 ENCOUNTER — Ambulatory Visit (HOSPITAL_COMMUNITY): Payer: Medicaid Other

## 2017-11-15 ENCOUNTER — Telehealth: Payer: Self-pay | Admitting: General Practice

## 2017-11-15 NOTE — Telephone Encounter (Signed)
Called patient with pacific interpreter [phone], no answer- left message on voicemail to call us back. Will send letter

## 2017-11-15 NOTE — Telephone Encounter (Signed)
-----   Message from Ugonna A Anyanwu, MD sent at 11/09/2017  2:12 PM EDT ----- Pap was negative for cervical cancer screening but showed bacterial vaginitis.   Metronidazole was prescribed. Please inform patient of results and advise to pick up prescription. Speaks Arabic.  

## 2017-11-19 ENCOUNTER — Encounter: Payer: Self-pay | Admitting: Family Medicine

## 2017-11-19 ENCOUNTER — Ambulatory Visit (HOSPITAL_COMMUNITY)
Admission: RE | Admit: 2017-11-19 | Discharge: 2017-11-19 | Disposition: A | Discharge status: Home / Self Care | Payer: Self-pay | Source: Ambulatory Visit | Attending: Obstetrics & Gynecology | Admitting: Obstetrics & Gynecology

## 2017-11-19 DIAGNOSIS — N939 Abnormal uterine and vaginal bleeding, unspecified: Secondary | ICD-10-CM | POA: Insufficient documentation

## 2017-11-19 DIAGNOSIS — N921 Excessive and frequent menstruation with irregular cycle: Secondary | ICD-10-CM

## 2017-11-19 DIAGNOSIS — Z975 Presence of (intrauterine) contraceptive device: Secondary | ICD-10-CM

## 2017-11-19 DIAGNOSIS — N83292 Other ovarian cyst, left side: Secondary | ICD-10-CM | POA: Insufficient documentation

## 2017-11-19 DIAGNOSIS — N83209 Unspecified ovarian cyst, unspecified side: Secondary | ICD-10-CM | POA: Insufficient documentation

## 2017-11-19 DIAGNOSIS — Z978 Presence of other specified devices: Secondary | ICD-10-CM | POA: Insufficient documentation

## 2017-11-22 MED FILL — metroNIDAZOLE 500 MG TABS: 500 | 7 days supply | Qty: 14 | Fill #0

## 2017-12-11 ENCOUNTER — Ambulatory Visit (INDEPENDENT_AMBULATORY_CARE_PROVIDER_SITE_OTHER): Payer: BLUE CROSS/BLUE SHIELD | Admitting: Obstetrics & Gynecology

## 2017-12-11 ENCOUNTER — Encounter: Payer: Self-pay | Admitting: Obstetrics & Gynecology

## 2017-12-11 VITALS — BP 103/60 | HR 70 | Ht 62.0 in | Wt 126.7 lb

## 2017-12-11 DIAGNOSIS — Z309 Encounter for contraceptive management, unspecified: Secondary | ICD-10-CM

## 2017-12-11 DIAGNOSIS — N921 Excessive and frequent menstruation with irregular cycle: Secondary | ICD-10-CM

## 2017-12-11 DIAGNOSIS — Z975 Presence of (intrauterine) contraceptive device: Principal | ICD-10-CM

## 2017-12-11 LAB — POCT PREGNANCY, URINE: PREG TEST UR: NEGATIVE

## 2017-12-11 MED ORDER — NORGESTIMATE-ETH ESTRADIOL 0.25-35 MG-MCG PO TABS: 1.0000 | ORAL_TABLET | Freq: Every day | ORAL | 5 refills | Status: DC

## 2017-12-11 MED ORDER — NORGESTIMATE-ETH ESTRADIOL 0.25-35 MG-MCG PO TABS: 1.0000 | ORAL_TABLET | Freq: Every day | ORAL | 3 refills | Status: DC

## 2017-12-11 MED FILL — PREVIFEM 0.25-35 MG-MCG TAB: 0.25-35 | 28 days supply | Qty: 28 | Fill #0

## 2017-12-11 NOTE — Progress Notes (Signed)
GYNECOLOGY OFFICE VISIT NOTE  History:  42 y.o. [id number] here today for follow up after management of breakthrough bleeding (BTB) on Nexplanon. Due to language barrier, an Arabic interpreter was present during the history-taking and subsequent discussion (and for part of the physical exam) with this patient. Was prescribed OCPs last visit, this helped with bleeding a lot.  Wants to continue OCPs and Nexplanon, declines removal or other intervention.  Pelvic ultrasound showed possible adenomyosis and physiologic cyst. Last Hgb was 12. She denies any abnormal vaginal discharge, bleeding, pelvic pain or other concerns.   Past Medical History:  Diagnosis Date  . Medical history non-contributory     Past Surgical History:  Procedure Laterality Date  . CESAREAN SECTION      The following portions of the patient's history were reviewed and updated as appropriate: allergies, current medications, past family history, past medical history, past social history, past surgical history and problem list.   Health Maintenance:  Normal pap and negative HRHPV on 11/07/2017.  Patient declines mammogram for now, no pertinent history. Plans to start at age 42.  Review of Systems:  Pertinent items noted in HPI and remainder of comprehensive ROS otherwise negative.  Objective:  Physical Exam BP 103/60   Pulse 70   Ht 5\' 2"  (1.575 m)   Wt 126 lb 11.2 oz (57.5 kg)   LMP 12/10/2017   BMI 23.17 kg/m  CONSTITUTIONAL: Well-developed, well-nourished female in no acute distress.  HEENT:  Normocephalic, atraumatic. External right and left ear normal. No scleral icterus.  NECK: Normal range of motion, supple, no masses noted on observation SKIN: Skin is warm and dry. No rash noted. Not diaphoretic. No erythema. No pallor. MUSCULOSKELETAL: Normal range of motion. No edema noted. NEUROLOGIC: Alert and oriented to person, place, and time. Normal muscle tone coordination. No cranial nerve deficit  noted. PSYCHIATRIC: Normal mood and affect. Normal behavior. Normal judgment and thought content. CARDIOVASCULAR: Normal heart rate noted RESPIRATORY: Effort and breath sounds normal, no problems with respiration noted ABDOMEN: Soft, no distention noted.   PELVIC: Deferred  Labs and Imaging  CBC Latest Ref Rng & Units 11/07/2017 08/07/2015 05/24/2015  WBC 3.4 - 10.8 x10E3/uL 4.6 7.5 6.3  Hemoglobin 11.1 - 15.9 g/dL 45.412.0 09.813.6 11.5(L)  Hematocrit 34.0 - 46.6 % 36.2 40.4 34.4(L)  Platelets 150 - 450 x10E3/uL 271 181 192    Results for orders placed or performed in visit on 12/11/17 (from the past 168 hour(s))  Pregnancy, urine POC   Collection Time: 12/11/17  3:04 PM  Result Value Ref Range   Preg Test, Ur NEGATIVE NEGATIVE   [place] Pelvic Complete With Transvaginal  Result Date: 11/19/2017 CLINICAL DATA:  Abnormal uterine bleeding EXAM: TRANSABDOMINAL AND TRANSVAGINAL ULTRASOUND OF PELVIS TECHNIQUE: Both transabdominal and transvaginal ultrasound examinations of the pelvis were performed. Transabdominal technique was performed for global imaging of the pelvis including uterus, ovaries, adnexal regions, and pelvic cul-de-sac. It was necessary to proceed with endovaginal exam following the transabdominal exam to visualize the uterus, endometrium, ovaries and adnexa. COMPARISON:  None FINDINGS: Uterus Measurements: 9.0 x 4.5 x 5.0 cm. Heterogeneous echotexture. No focal fibroid. Endometrium Thickness: 5 mm in thickness.  No focal abnormality visualized. Right ovary Measurements: Not visualized.  No adnexal mass seen. Left ovary Measurements: 5.9 x 3.1 x 3.8 cm.  5.1 cm simple appearing cyst. Other findings Small amount of free fluid. IMPRESSION: Heterogeneous echotexture throughout the uterus which can be seen with adenomyosis. Endometrium normal in thickness and appearance.  5.1 cm simple appearing left ovarian cyst. This is almost certainly benign, but follow up ultrasound is recommended in 1 year  according to the Society of Radiologists in Ultrasound2010 Consensus Conference Statement (D Lenis NoonLevine et al. Management of Asymptomatic Ovarian and Other Adnexal Cysts Imaged at US: Society of Radiologists in Ultrasound Consensus Conference Statement 2010. Radiology 256 (Sept 2010): 943-954.). For Electronically Signed   By: Charlett NoseKevin  Dover M.D.   On: 11/19/2017 11:05    Assessment & Plan:  1. Breakthrough bleeding on Nexplanon Continue OCPs, refills ordered. Bleeding precautions reviewed. - norgestimate-ethinyl estradiol (ORTHO-CYCLEN,SPRINTEC,PREVIFEM) 0.25-35 MG-MCG tablet; Take 1 tablet by mouth daily.  Dispense: 3 Package; Refill: 5  Routine preventative health maintenance measures emphasized. Please refer to After Visit Summary for other counseling recommendations.   Return if symptoms worsen or fail to improve.   Total face-to-face time with patient: 15 minutes.  Over 50% of encounter was spent on counseling and coordination of care.   Jaynie CollinsUGONNA  ANYANWU, MD, FACOG Obstetrician & Gynecologist, [facility], [facility]

## 2017-12-11 NOTE — Progress Notes (Signed)
States bleeding is better

## 2017-12-11 NOTE — Patient Instructions (Signed)
Return to clinic for any scheduled appointments or for any gynecologic concerns as needed.   

## 2018-01-19 ENCOUNTER — Emergency Department (HOSPITAL_COMMUNITY)
Admission: EM | Admit: 2018-01-19 | Discharge: 2018-01-19 | Disposition: A | Discharge status: Home / Self Care | Payer: BLUE CROSS/BLUE SHIELD | Attending: Emergency Medicine | Admitting: Emergency Medicine

## 2018-01-19 ENCOUNTER — Other Ambulatory Visit: Payer: Self-pay

## 2018-01-19 ENCOUNTER — Emergency Department (HOSPITAL_COMMUNITY): Payer: BLUE CROSS/BLUE SHIELD

## 2018-01-19 DIAGNOSIS — Y929 Unspecified place or not applicable: Secondary | ICD-10-CM | POA: Insufficient documentation

## 2018-01-19 DIAGNOSIS — S81012A Laceration without foreign body, left knee, initial encounter: Secondary | ICD-10-CM | POA: Insufficient documentation

## 2018-01-19 DIAGNOSIS — W25XXXA Contact with sharp glass, initial encounter: Secondary | ICD-10-CM | POA: Insufficient documentation

## 2018-01-19 DIAGNOSIS — S81021A Laceration with foreign body, right knee, initial encounter: Secondary | ICD-10-CM | POA: Diagnosis not present

## 2018-01-19 DIAGNOSIS — S61411A Laceration without foreign body of right hand, initial encounter: Secondary | ICD-10-CM | POA: Diagnosis present

## 2018-01-19 DIAGNOSIS — Y999 Unspecified external cause status: Secondary | ICD-10-CM | POA: Insufficient documentation

## 2018-01-19 DIAGNOSIS — S81011A Laceration without foreign body, right knee, initial encounter: Secondary | ICD-10-CM

## 2018-01-19 DIAGNOSIS — Y9389 Activity, other specified: Secondary | ICD-10-CM | POA: Diagnosis not present

## 2018-01-19 MED ORDER — LIDOCAINE-EPINEPHRINE (PF) 2 %-1:200000 IJ SOLN
20.0000 mL | Freq: Once | INTRAMUSCULAR | Status: AC
  Administered 2018-01-19: 20 mL
  Filled 2018-01-19: qty 20

## 2018-01-19 MED ORDER — OXYCODONE-ACETAMINOPHEN 5-325 MG PO TABS
2.0000 | ORAL_TABLET | Freq: Once | ORAL | Status: AC
Start: 2018-01-19 — End: 2018-01-19
  Administered 2018-01-19: 2 via ORAL
  Filled 2018-01-19: qty 2

## 2018-01-19 NOTE — ED Notes (Signed)
Patient taken to XRAY

## 2018-01-19 NOTE — ED Provider Notes (Signed)
[facility] EMERGENCY DEPARTMENT Provider Note   CSN: [id number] Arrival date & time: 01/19/18  0021     History   Chief Complaint Chief Complaint  Patient presents with  . Laceration    HPI Nancy Wright is a 42 y.o. female with a hx of no known medical problems presents to the Emergency Department complaining of acute persistent laceration to the right hand, right knee and left knee. Associated symptoms include pain at the laceration sites.  Pressure applied in the ED with improvement of bleeding.  Movement makes the pain worse.  Pt reports she was walking with a glass in her hand when she tripped and fell, breaking the glass and landing on it.  Pt reports her last tetanus shot was 1 year ago.  She denies immunocompromise, fever, headache, vision changes, syncope, chest pain, SOB.     The history is provided by the patient and medical records. No language interpreter was used.    Past Medical History:  Diagnosis Date  . Medical history non-contributory     Patient Active Problem List   Diagnosis Date Noted  . Ovarian cyst 11/19/2017  . Language barrier; speaks Arabic 12/31/2014    Past Surgical History:  Procedure Laterality Date  . CESAREAN SECTION       OB History    Gravida  4   Para  4   Term  4   Preterm      AB      Living  4     SAB      TAB      Ectopic      Multiple  0   Live Births  4            Home Medications    Prior to Admission medications   Medication Sig Start Date End Date Taking? Authorizing Provider  norgestimate-ethinyl estradiol (ORTHO-CYCLEN,SPRINTEC,PREVIFEM) 0.25-35 MG-MCG tablet Take 1 tablet by mouth daily. Patient not taking: Reported on 01/19/2018 12/11/17   Tereso Newcomer, MD    Family History Family History  Problem Relation Age of Onset  . Diabetes Father   . Hypertension Father     Social History Social History   Tobacco Use  . Smoking status: Never Smoker  . Smokeless tobacco:  Never Used  Substance Use Topics  . Alcohol use: No  . Drug use: No     Allergies   Patient has no known allergies.   Review of Systems Review of Systems  Constitutional: Negative for fever.  HENT: Negative for facial swelling.   Eyes: Negative for visual disturbance.  Respiratory: Negative for shortness of breath.   Cardiovascular: Negative for chest pain.  Gastrointestinal: Negative for nausea and vomiting.  Musculoskeletal: Positive for arthralgias.  Skin: Positive for wound.  Allergic/Immunologic: Negative for immunocompromised state.  Neurological: Negative for weakness and numbness.  Hematological: Does not bruise/bleed easily.  Psychiatric/Behavioral: The patient is not nervous/anxious.      Physical Exam Updated Vital Signs BP 122/88   Pulse 80   Temp 97.6 F (36.4 C) (Oral)   Resp 19   SpO2 99%   Physical Exam  Constitutional: She is oriented to person, place, and time. She appears well-developed and well-nourished. No distress.  HENT:  Head: Normocephalic and atraumatic.  Eyes: Conjunctivae are normal. No scleral icterus.  Neck: Normal range of motion.  Cardiovascular: Normal rate, regular rhythm, normal heart sounds and intact distal pulses.  No murmur heard. Capillary refill < 3 sec  Pulmonary/Chest: Effort normal and breath sounds normal. No respiratory distress.  Musculoskeletal: Normal range of motion. She exhibits no edema.       Right knee: She exhibits laceration. She exhibits normal range of motion, no swelling, no effusion, no ecchymosis and normal patellar mobility ( patellar tendon intact). No tenderness found.       Left knee: She exhibits laceration. She exhibits normal range of motion, no swelling, no effusion, no ecchymosis and normal patellar mobility ( patellar tendon intact). No tenderness found.       Right hand: She exhibits laceration. She exhibits normal range of motion, no bony tenderness and normal capillary refill. Normal sensation  noted. Normal strength noted.  Neurological: She is alert and oriented to person, place, and time.  Sensation: intact to normal touch in all extremities Strength: 5/5 in the BUE and BLE including grip strength Pt ambulates with steady gait  Skin: Skin is warm and dry. She is not diaphoretic.  3.5cm laceration over the right thenar eminence 3.0cm L shaped laceration over the right knee - no open joint 2.8cm laceration over the left knee - no open joint  Psychiatric: She has a normal mood and affect.  Nursing note and vitals reviewed.    ED Treatments / Results   Radiology Dg Knee Complete 4 Views Right  Result Date: 01/19/2018 CLINICAL DATA:  Laceration of the right knee. EXAM: RIGHT KNEE - COMPLETE 4+ VIEW COMPARISON:  None. FINDINGS: Radiopaque foreign body projects within the anteromedial aspect of the knee measuring approximately 8 x 13 x 2 mm. Soft tissue swelling and induration is identified. Osteoarthritic subchondral cystic change of the patella across the patellofemoral joint. No acute fracture or joint effusion. IMPRESSION: 1. Radiopaque foreign body along the anteromedial aspect of the knee measuring 8 x 13 x 2 mm. 2. Anterior soft tissue swelling projects over the patellar tendon. 3. Patellofemoral osteoarthritis.  No acute osseous abnormality. Electronically Signed   By: Tollie Eth M.D.   On: 01/19/2018 01:31   Dg Hand Complete Right  Result Date: 01/19/2018 CLINICAL DATA:  Laceration to palm of hand. EXAM: RIGHT HAND - COMPLETE 3+ VIEW COMPARISON:  None. FINDINGS: Fine bony detail is slightly limited by overlying bandaging along the palmar aspect of the hand. No radiopaque foreign body, fracture or joint dislocation. Carpal rows are maintained. The distal radius and ulna are intact. IMPRESSION: No acute osseous abnormality of the right hand and wrist. Electronically Signed   By: Tollie Eth M.D.   On: 01/19/2018 01:29    Procedures .Marland KitchenLaceration Repair Date/Time: 01/19/2018  3:51 AM Performed by: Dierdre Forth, PA-C Authorized by: Dierdre Forth, PA-C   Consent:    Consent obtained:  Verbal   Consent given by:  Patient   Risks discussed:  Infection, need for additional repair, pain, poor cosmetic result and poor wound healing   Alternatives discussed:  No treatment and delayed treatment Universal protocol:    Procedure explained and questions answered to patient or proxy's satisfaction: yes     Relevant documents present and verified: yes     Test results available and properly labeled: yes     Imaging studies available: yes     Required blood products, implants, devices, and special equipment available: yes     Site/side marked: yes     Immediately prior to procedure, a time out was called: yes     Patient identity confirmed:  Verbally with patient Anesthesia (see MAR for exact dosages):    Anesthesia  method:  Local infiltration   Local anesthetic:  Lidocaine 2% WITH epi Laceration details:    Location:  Hand   Hand location:  R palm   Length (cm):  3.5 Repair type:    Repair type:  Intermediate Pre-procedure details:    Preparation:  Patient was prepped and draped in usual sterile fashion and imaging obtained to evaluate for foreign bodies Exploration:    Hemostasis achieved with:  Epinephrine and direct pressure   Wound exploration: wound explored through full range of motion and entire depth of wound probed and visualized   Treatment:    Area cleansed with:  Saline   Amount of cleaning:  Standard   Irrigation solution:  Sterile water   Irrigation volume:    Irrigation method:  Syringe Skin repair:    Repair method:  Sutures   Suture size:  4-0   Suture material:  Prolene   Suture technique:  Simple interrupted   Number of sutures:  8 Approximation:    Approximation:  Close Post-procedure details:    Dressing:  Open (no dressing)   Patient tolerance of procedure:  Tolerated well, no immediate  complications .Marland KitchenLaceration Repair Date/Time: 01/19/2018 3:51 AM Performed by: Dierdre Forth, PA-C Authorized by: Dierdre Forth, PA-C   Consent:    Consent obtained:  Verbal   Consent given by:  Patient   Risks discussed:  Infection, need for additional repair, pain, poor cosmetic result and poor wound healing   Alternatives discussed:  No treatment and delayed treatment Universal protocol:    Procedure explained and questions answered to patient or proxy's satisfaction: yes     Relevant documents present and verified: yes     Test results available and properly labeled: yes     Imaging studies available: yes     Required blood products, implants, devices, and special equipment available: yes     Site/side marked: yes     Immediately prior to procedure, a time out was called: yes     Patient identity confirmed:  Verbally with patient Anesthesia (see MAR for exact dosages):    Anesthesia method:  Local infiltration   Local anesthetic:  Lidocaine 2% WITH epi Laceration details:    Location:  Leg   Leg location:  R knee   Length (cm):  3 Repair type:    Repair type:  Intermediate Pre-procedure details:    Preparation:  Patient was prepped and draped in usual sterile fashion Exploration:    Hemostasis achieved with:  Epinephrine and direct pressure   Wound exploration: wound explored through full range of motion and entire depth of wound probed and visualized   Treatment:    Area cleansed with:  Saline   Amount of cleaning:  Standard   Irrigation solution:  Sterile water   Irrigation volume:  500   Irrigation method:  Syringe   Visualized foreign bodies/material removed: yes   Skin repair:    Repair method:  Sutures   Suture size:  3-0   Suture material:  Prolene   Suture technique:  Simple interrupted and horizontal mattress   Number of sutures:  3 Approximation:    Approximation:  Close Post-procedure details:    Dressing:  Open (no dressing)   Patient  tolerance of procedure:  Tolerated well, no immediate complications .Marland KitchenLaceration Repair Date/Time: 01/19/2018 3:55 AM Performed by: Dierdre Forth, PA-C Authorized by: Dierdre Forth, PA-C   Consent:    Consent obtained:  Verbal   Consent given by:  Patient  Risks discussed:  Infection, need for additional repair, pain, poor cosmetic result and poor wound healing   Alternatives discussed:  No treatment and delayed treatment Universal protocol:    Procedure explained and questions answered to patient or proxy's satisfaction: yes     Relevant documents present and verified: yes     Test results available and properly labeled: yes     Imaging studies available: yes     Required blood products, implants, devices, and special equipment available: yes     Site/side marked: yes     Immediately prior to procedure, a time out was called: yes     Patient identity confirmed:  Verbally with patient Anesthesia (see MAR for exact dosages):    Anesthesia method:  Local infiltration   Local anesthetic:  Lidocaine 2% WITH epi Laceration details:    Location:  Leg   Leg location:  L knee   Length (cm):  2.8 Repair type:    Repair type:  Simple Pre-procedure details:    Preparation:  Patient was prepped and draped in usual sterile fashion Exploration:    Hemostasis achieved with:  Epinephrine and direct pressure   Wound exploration: wound explored through full range of motion and entire depth of wound probed and visualized   Treatment:    Area cleansed with:  Saline   Amount of cleaning:  Standard   Irrigation solution:  Sterile water   Irrigation volume:  500   Irrigation method:  Syringe Skin repair:    Repair method:  Sutures   Suture size:  3-0   Suture material:  Prolene   Suture technique:  Simple interrupted   Number of sutures:  4 Approximation:    Approximation:  Close Post-procedure details:    Dressing:  Open (no dressing)   Patient tolerance of procedure:   Tolerated well, no immediate complications .Foreign Body Removal Date/Time: 01/19/2018 3:59 AM Performed by: Dierdre ForthMuthersbaugh, Narayan Scull, PA-C Authorized by: Dierdre ForthMuthersbaugh, Tjuana Vickrey, PA-C  Consent: Verbal consent obtained. Risks and benefits: risks, benefits and alternatives were discussed Consent given by: patient Patient identity confirmed: verbally with patient Time out: Immediately prior to procedure a "time out" was called to verify the correct patient, procedure, equipment, support staff and site/side marked as required. Body area: skin General location: lower extremity Location details: right knee  Anesthesia: Local Anesthetic: lidocaine 2% with epinephrine Anesthetic total: 5 mL  Sedation: Patient sedated: no  Patient restrained: no Patient cooperative: yes Localization method: visualized Removal mechanism: forceps Depth: deep Complexity: simple 1 objects recovered. Objects recovered: glass Post-procedure assessment: foreign body removed Patient tolerance: Patient tolerated the procedure well with no immediate complications   (including critical care time)  Medications Ordered in ED Medications  lidocaine-EPINEPHrine (XYLOCAINE W/EPI) 2 %-1:200000 (PF) injection 20 mL (20 mLs Infiltration Given by Other 01/19/18 0207)  oxyCODONE-acetaminophen (PERCOCET/ROXICET) 5-325 MG per tablet 2 tablet (2 tablets Oral Given 01/19/18 0206)     Initial Impression / Assessment and Plan / ED Course  I have reviewed the triage vital signs and the nursing notes.  Pertinent labs & imaging results that were available during my care of the patient were reviewed by me and considered in my medical decision making (see chart for details).     Pressure irrigation performed. Wound explored and base of wound visualized in a bloodless field.  Right knee with evidence of glass confirmed on x-ray.  I personally evaluated the images.  Images of the hand without foreign body.  Left knee laceration is  superficial.  Laceration occurred < 8 hours prior to repair which was well tolerated.   Tdap up to date.  Pt has no comorbidities to effect normal wound healing. Pt discharged without antibiotics.  Discussed suture home care with patient and answered questions. Pt to follow-up for wound check and suture removal in 7 days; they are to return to the ED sooner for signs of infection. Pt is hemodynamically stable with no complaints prior to dc.    Final Clinical Impressions(s) / ED Diagnoses   Final diagnoses:  Laceration of right hand without foreign body, initial encounter  Knee laceration, right, initial encounter  Knee laceration, left, initial encounter    ED Discharge Orders    None       Talea Manges, Boyd Kerbs 01/19/18 0406    Dione Booze, MD 01/19/18 [phone]

## 2018-01-19 NOTE — ED Notes (Signed)
Patient Alert and oriented to baseline. Stable and ambulatory to baseline. Patient verbalized understanding of the discharge instructions.  Patient belongings were taken by the patient.   

## 2018-01-19 NOTE — ED Notes (Signed)
Patient transported to X-ray 

## 2018-01-19 NOTE — ED Triage Notes (Addendum)
Patient tripped and fell and has open lac to palm of right hand after hitting concrete. Small lac to right knee.

## 2018-01-19 NOTE — Discharge Instructions (Addendum)

## 2018-01-20 ENCOUNTER — Encounter: Payer: Self-pay | Admitting: *Deleted

## 2018-01-27 ENCOUNTER — Ambulatory Visit (HOSPITAL_COMMUNITY)
Admission: EM | Admit: 2018-01-27 | Discharge: 2018-01-27 | Disposition: A | Discharge status: Home / Self Care | Payer: BLUE CROSS/BLUE SHIELD

## 2018-01-27 NOTE — ED Notes (Signed)
Pt presented to have 2 suture removed from left knee 3 sutures removed from right knee 6 sutures removed from right hand

## 2018-02-13 MED FILL — IBUPROFEN 800 MG TAB: 800 | 10 days supply | Qty: 30 | Fill #0

## 2018-09-02 ENCOUNTER — Encounter (HOSPITAL_COMMUNITY): Payer: Self-pay

## 2018-09-02 ENCOUNTER — Other Ambulatory Visit: Payer: Self-pay

## 2018-09-02 ENCOUNTER — Ambulatory Visit (HOSPITAL_COMMUNITY)
Admission: EM | Admit: 2018-09-02 | Discharge: 2018-09-02 | Disposition: A | Discharge status: Home / Self Care | Payer: BLUE CROSS/BLUE SHIELD | Attending: Family Medicine | Admitting: Family Medicine

## 2018-09-02 DIAGNOSIS — R509 Fever, unspecified: Secondary | ICD-10-CM | POA: Diagnosis not present

## 2018-09-02 DIAGNOSIS — B349 Viral infection, unspecified: Secondary | ICD-10-CM | POA: Diagnosis not present

## 2018-09-02 MED ORDER — CETIRIZINE HCL 10 MG PO TABS: 10.0000 mg | ORAL_TABLET | Freq: Every day | ORAL | 0 refills | Status: DC

## 2018-09-02 MED ORDER — FLUTICASONE PROPIONATE 50 MCG/ACT NA SUSP: 1.0000 | Freq: Every day | NASAL | 2 refills | Status: DC

## 2018-09-02 MED ORDER — KETOROLAC TROMETHAMINE 30 MG/ML IJ SOLN
INTRAMUSCULAR | Status: AC
  Filled 2018-09-02: qty 1

## 2018-09-02 MED ORDER — KETOROLAC TROMETHAMINE 30 MG/ML IJ SOLN
30.0000 mg | Freq: Once | INTRAMUSCULAR | Status: AC
  Administered 2018-09-02: 30 mg via INTRAMUSCULAR

## 2018-09-02 NOTE — ED Provider Notes (Signed)
[facility]    CSN: [id number] Arrival date & time: 09/02/18  1429     History   Chief Complaint Chief Complaint  Patient presents with  . Fever    HPI Careen Liam Rogersoto is a 43 y.o. female.   Patient is a 43 year old female that presents today with headache, body aches and fever. She has also has some fullness in the ears.  Reports that her symptoms started on 11 days ago.  She was feeling better and returned  to work this morning.  When she got to work she started feeling bad again.  She has not taken anything for her symptoms.  She works at [employer] with her known multiple COVID cases.  Denies any cough, chest congestion, sore throat.  Denies any chest pain or shortness of breath.  ROS per HPI      Past Medical History:  Diagnosis Date  . Medical history non-contributory     Patient Active Problem List   Diagnosis Date Noted  . Ovarian cyst 11/19/2017  . Language barrier; speaks Arabic 12/31/2014    Past Surgical History:  Procedure Laterality Date  . CESAREAN SECTION      OB History    Gravida  4   Para  4   Term  4   Preterm      AB      Living  4     SAB      TAB      Ectopic      Multiple  0   Live Births  4            Home Medications    Prior to Admission medications   Medication Sig Start Date End Date Taking? Authorizing Provider  cetirizine (ZYRTEC) 10 MG tablet Take 1 tablet (10 mg total) by mouth daily. 09/02/18   Anchor Dwan, Gloris Manchesterraci A, NP  fluticasone (FLONASE) 50 MCG/ACT nasal spray Place 1 spray into both nostrils daily. 09/02/18   Dahlia ByesBast, Abrahim Sargent A, NP  norgestimate-ethinyl estradiol (ORTHO-CYCLEN,SPRINTEC,PREVIFEM) 0.25-35 MG-MCG tablet Take 1 tablet by mouth daily. Patient not taking: Reported on 01/19/2018 12/11/17   Tereso NewcomerAnyanwu, Ugonna A, MD    Family History Family History  Problem Relation Age of Onset  . Diabetes Father   . Hypertension Father     Social History Social History   Tobacco Use  . Smoking  status: Never Smoker  . Smokeless tobacco: Never Used  Substance Use Topics  . Alcohol use: No  . Drug use: No     Allergies   Patient has no known allergies.   Review of Systems Review of Systems   Physical Exam Triage Vital Signs ED Triage Vitals  Enc Vitals Group     BP 09/02/18 1445 108/73     Pulse Rate 09/02/18 1445 76     Resp 09/02/18 1445 16     Temp 09/02/18 1445 98.6 F (37 C)     Temp Source 09/02/18 1445 Oral     SpO2 09/02/18 1445 100 %     Weight 09/02/18 1450 160 lb (72.6 kg)     Height --      Head Circumference --      Peak Flow --      Pain Score 09/02/18 1449 5     Pain Loc --      Pain Edu? --      Excl. in GC? --    No data found.  Updated Vital Signs BP 108/73 (BP  Location: Right Arm)   Pulse 76   Temp 98.6 F (37 C) (Oral)   Resp 16   Wt 160 lb (72.6 kg)   LMP 08/12/2018   SpO2 100%   BMI 29.26 kg/m   Visual Acuity Right Eye Distance:   Left Eye Distance:   Bilateral Distance:    Right Eye Near:   Left Eye Near:    Bilateral Near:     Physical Exam Vitals signs and nursing note reviewed.  Constitutional:      General: She is not in acute distress.    Appearance: Normal appearance. She is not ill-appearing, toxic-appearing or diaphoretic.  HENT:     Head: Normocephalic and atraumatic.     Right Ear: Tympanic membrane and ear canal normal.     Left Ear: Tympanic membrane and ear canal normal.     Nose: Nose normal.     Mouth/Throat:     Pharynx: Oropharynx is clear.  Eyes:     Conjunctiva/sclera: Conjunctivae normal.  Neck:     Musculoskeletal: Normal range of motion.  Cardiovascular:     Pulses: Normal pulses.  Pulmonary:     Effort: Pulmonary effort is normal.  Musculoskeletal: Normal range of motion.  Skin:    General: Skin is warm and dry.     Findings: No rash.  Neurological:     Mental Status: She is alert.  Psychiatric:        Mood and Affect: Mood normal.      UC Treatments / Results  Labs (all  labs ordered are listed, but only abnormal results are displayed) Labs Reviewed - No data to display  EKG None  Radiology No results found.  Procedures Procedures (including critical care time)  Medications Ordered in UC Medications  ketorolac (TORADOL) 30 MG/ML injection 30 mg (has no administration in time range)    Initial Impression / Assessment and Plan / UC Course  I have reviewed the triage vital signs and the nursing notes.  Pertinent labs & imaging results that were available during my care of the patient were reviewed by me and considered in my medical decision making (see chart for details).     Patient is a 43 year old female coming with viral type symptoms. Patient works at [employer] with known COVID positive exposures. Reports that her symptoms somewhat improved but then returned worsening this morning when she went back  to work. She is  still having COVID type symptoms. I recommended she stay out of work until her symptoms improve. Toradol injection given here in clinic for headache Prescriptions for Zyrtec and Flonase for allergy type symptoms Follow up as needed for continued or worsening symptoms  Final Clinical Impressions(s) / UC Diagnoses   Final diagnoses:  Viral illness     Discharge Instructions     I believe this is some sort of viral illness or allergy related.  Toradol given here for headache Sending some Zyrtec and Flonase to the pharmacy for symptoms Work note given Follow up as needed for continued or worsening symptoms     ED Prescriptions    Medication Sig Dispense Auth. Provider   fluticasone (FLONASE) 50 MCG/ACT nasal spray  (Status: Discontinued) Place 1 spray into both nostrils daily. 16 g Olsen Mccutchan A, NP   cetirizine (ZYRTEC) 10 MG tablet  (Status: Discontinued) Take 1 tablet (10 mg total) by mouth daily. 30 tablet Raynell Upton A, NP   fluticasone (FLONASE) 50 MCG/ACT nasal spray Place 1 spray into both  nostrils daily.  16 g Marlin Brys A, NP   cetirizine (ZYRTEC) 10 MG tablet Take 1 tablet (10 mg total) by mouth daily. 30 tablet Dahlia Byes A, NP     Controlled Substance Prescriptions [place] Controlled Substance Registry consulted? Not Applicable   Janace Aris, NP 09/02/18 1537

## 2018-09-02 NOTE — ED Triage Notes (Signed)
Pt cc has been out of work for a week . Pt has been feeling bad fever and exhausted  2 weeks now.  Pt states there is one else in her house sick. Pt has not been taking any meds. Pt has a headache. Pt states her anxiety is flaring up.

## 2018-09-02 NOTE — Discharge Instructions (Signed)
I believe this is some sort of viral illness or allergy related.  Toradol given here for headache Sending some Zyrtec and Flonase to the pharmacy for symptoms Work note given Follow up as needed for continued or worsening symptoms

## 2018-10-06 ENCOUNTER — Other Ambulatory Visit: Payer: Self-pay

## 2018-10-06 ENCOUNTER — Ambulatory Visit (INDEPENDENT_AMBULATORY_CARE_PROVIDER_SITE_OTHER): Payer: BC Managed Care – PPO | Admitting: Obstetrics and Gynecology

## 2018-10-06 ENCOUNTER — Encounter: Payer: Self-pay | Admitting: Obstetrics and Gynecology

## 2018-10-06 DIAGNOSIS — Z30017 Encounter for initial prescription of implantable subdermal contraceptive: Secondary | ICD-10-CM

## 2018-10-06 DIAGNOSIS — Z3046 Encounter for surveillance of implantable subdermal contraceptive: Secondary | ICD-10-CM | POA: Diagnosis not present

## 2018-10-06 LAB — POCT PREGNANCY, URINE: Preg Test, Ur: NEGATIVE

## 2018-10-06 MED ORDER — ETONOGESTREL 68 MG ~~LOC~~ IMPL
68.0000 mg | DRUG_IMPLANT | Freq: Once | SUBCUTANEOUS | Status: AC
  Administered 2018-10-06: 68 mg via SUBCUTANEOUS

## 2018-10-06 NOTE — Progress Notes (Signed)
[place] interpreter on phone and interpreting for pt during the visit

## 2018-10-06 NOTE — Patient Instructions (Signed)
Etonogestrel implant  What is this medicine?  ETONOGESTREL (et oh noe JES trel) is a contraceptive (birth control) device. It is used to prevent pregnancy. It can be used for up to 3 years.  This medicine may be used for other purposes; ask your health care provider or pharmacist if you have questions.  COMMON BRAND NAME(S): Implanon, Nexplanon  What should I tell my health care provider before I take this medicine?  They need to know if you have any of these conditions:  -abnormal vaginal bleeding  -blood vessel disease or blood clots  -breast, cervical, endometrial, ovarian, liver, or uterine cancer  -diabetes  -gallbladder disease  -heart disease or recent heart attack  -high blood pressure  -high cholesterol or triglycerides  -kidney disease  -liver disease  -migraine headaches  -seizures  -stroke  -tobacco smoker  -an unusual or allergic reaction to etonogestrel, anesthetics or antiseptics, other medicines, foods, dyes, or preservatives  -pregnant or trying to get pregnant  -breast-feeding  How should I use this medicine?  This device is inserted just under the skin on the inner side of your upper arm by a health care professional.  Talk to your pediatrician regarding the use of this medicine in children. Special care may be needed.  Overdosage: If you think you have taken too much of this medicine contact a poison control center or emergency room at once.  NOTE: This medicine is only for you. Do not share this medicine with others.  What if I miss a dose?  This does not apply.  What may interact with this medicine?  Do not take this medicine with any of the following medications:  -amprenavir  -fosamprenavir  This medicine may also interact with the following medications:  -acitretin  -aprepitant  -armodafinil  -bexarotene  -bosentan  -carbamazepine  -certain medicines for fungal infections like fluconazole, ketoconazole, itraconazole and voriconazole  -certain medicines to treat hepatitis, HIV or  AIDS  -cyclosporine  -felbamate  -griseofulvin  -lamotrigine  -modafinil  -oxcarbazepine  -phenobarbital  -phenytoin  -primidone  -rifabutin  -rifampin  -rifapentine  -St. John's wort  -topiramate  This list may not describe all possible interactions. Give your health care provider a list of all the medicines, herbs, non-prescription drugs, or dietary supplements you use. Also tell them if you smoke, drink alcohol, or use illegal drugs. Some items may interact with your medicine.  What should I watch for while using this medicine?  This product does not protect you against HIV infection (AIDS) or other sexually transmitted diseases.  You should be able to feel the implant by pressing your fingertips over the skin where it was inserted. Contact your doctor if you cannot feel the implant, and use a non-hormonal birth control method (such as condoms) until your doctor confirms that the implant is in place. Contact your doctor if you think that the implant may have broken or become bent while in your arm.  You will receive a user card from your health care provider after the implant is inserted. The card is a record of the location of the implant in your upper arm and when it should be removed. Keep this card with your health records.  What side effects may I notice from receiving this medicine?  Side effects that you should report to your doctor or health care professional as soon as possible:  -allergic reactions like skin rash, itching or hives, swelling of the face, lips, or tongue  -breast lumps, breast tissue   changes, or discharge  -breathing problems  -changes in emotions or moods  -if you feel that the implant may have broken or bent while in your arm  -high blood pressure  -pain, irritation, swelling, or bruising at the insertion site  -scar at site of insertion  -signs of infection at the insertion site such as fever, and skin redness, pain or discharge  -signs and symptoms of a blood clot such as breathing  problems; changes in vision; chest pain; severe, sudden headache; pain, swelling, warmth in the leg; trouble speaking; sudden numbness or weakness of the face, arm or leg  -signs and symptoms of liver injury like dark yellow or brown urine; general ill feeling or flu-like symptoms; light-colored stools; loss of appetite; nausea; right upper belly pain; unusually weak or tired; yellowing of the eyes or skin  -unusual vaginal bleeding, discharge  Side effects that usually do not require medical attention (report to your doctor or health care professional if they continue or are bothersome):  -acne  -breast pain or tenderness  -headache  -irregular menstrual bleeding  -nausea  This list may not describe all possible side effects. Call your doctor for medical advice about side effects. You may report side effects to FDA at 1-800-FDA-1088.  Where should I keep my medicine?  This drug is given in a hospital or clinic and will not be stored at home.  NOTE: This sheet is a summary. It may not cover all possible information. If you have questions about this medicine, talk to your doctor, pharmacist, or health care provider.   2019 Elsevier/Gold Standard (2017-03-05 14:11:42)

## 2018-10-06 NOTE — Progress Notes (Signed)
     GYNECOLOGY CLINIC PROCEDURE NOTE  Nancy Wright is a 43 y.o. [id number] here for Nexplanon removal and Nexplanon insertion.    Nexplanon Removal and Insertion  Patient identified, informed consent performed, consent signed.   Patient does understand that irregular bleeding is a very common side effect of this medication. She was advised to have backup contraception for one week after replacement of the implant. Pregnancy test in clinic today was negative.  Appropriate time out taken. Implanon site identified. Area prepped in usual sterile fashon. One ml of 1% lidocaine was used to anesthetize the area at the distal end of the implant. A small stab incision was made right beside the implant on the distal portion. The Nexplanon rod was grasped using hemostats and removed without difficulty. There was minimal blood loss. There were no complications. The left arm area was then injected with 3 ml of 1 % lidocaine. She was re-prepped with betadine, Nexplanon removed from packaging, Device confirmed in needle, then inserted full length of needle and withdrawn per handbook instructions. Nexplanon was able to palpated in the patient's arm; patient palpated the insert herself.  There was minimal blood loss. Patient insertion site covered with guaze and a pressure bandage to reduce any bruising. The patient tolerated the procedure well and was given post procedure instructions.  She was advised to have backup contraception for one week.    Arlina Robes Attending [facility] for [employer], [place]

## 2018-10-07 ENCOUNTER — Encounter: Payer: Self-pay | Admitting: *Deleted

## 2018-10-27 MED FILL — BUTALB-ACETAMIN-CAFF 50-325: 50-325-40 | 7 days supply | Qty: 30 | Fill #0

## 2018-10-27 MED FILL — AMITRIPTYLINE HCL 25 MG TAB: 25 | 30 days supply | Qty: 30 | Fill #0

## 2019-02-05 ENCOUNTER — Ambulatory Visit (HOSPITAL_COMMUNITY)
Admission: EM | Admit: 2019-02-05 | Discharge: 2019-02-05 | Disposition: A | Discharge status: Home / Self Care | Payer: BC Managed Care – PPO | Attending: Family Medicine | Admitting: Family Medicine

## 2019-02-05 ENCOUNTER — Encounter (HOSPITAL_COMMUNITY): Payer: Self-pay

## 2019-02-05 ENCOUNTER — Other Ambulatory Visit: Payer: Self-pay

## 2019-02-05 DIAGNOSIS — Z8616 Personal history of COVID-19: Secondary | ICD-10-CM

## 2019-02-05 DIAGNOSIS — Z20828 Contact with and (suspected) exposure to other viral communicable diseases: Secondary | ICD-10-CM | POA: Insufficient documentation

## 2019-02-05 DIAGNOSIS — Z20822 Contact with and (suspected) exposure to covid-19: Secondary | ICD-10-CM

## 2019-02-05 DIAGNOSIS — Z8619 Personal history of other infectious and parasitic diseases: Secondary | ICD-10-CM | POA: Diagnosis present

## 2019-02-05 DIAGNOSIS — R519 Headache, unspecified: Secondary | ICD-10-CM | POA: Diagnosis present

## 2019-02-05 MED ORDER — BUTALBITAL-APAP-CAFFEINE 50-325-40 MG PO TABS: 1.0000 | ORAL_TABLET | Freq: Four times a day (QID) | ORAL | 0 refills | Status: AC | PRN

## 2019-02-05 NOTE — ED Triage Notes (Signed)
Pt states she was sent home from work with a headache. Pt states she need to do a Covid test before she could go back to work. This happened today.

## 2019-02-05 NOTE — ED Provider Notes (Signed)
Nancy Wright    CSN: [id number] Arrival date & time: 02/05/19  1516      History   Chief Complaint Chief Complaint  Patient presents with  . Headache    HPI Nancy Wright is a 43 y.o. female.   Subjective:  Arabic translation [name] [phone]  [name] is a 43 y.o. female who presents for evaluation of headache. Symptoms began about 4 days ago. Rapidity of onset was gradual. The patient gets headaches infrequently. The headache is described as dull and throbbing and is bilateral in location.  The patient rates the pain a 5 on a scale from 1 to 10. Precipitating factors include none which have been determined. The headache was not preceded by an aura.The patient denies muscle weakness, numbness of extremities, speech difficulties, vision problems, fevers, chills, myalgias, sore throat, cough, shortness of breath, nausea, vomiting, diarrhea or change in taste/smell. Home treatment has included OTC analgesics with inadequate improvement. Notably, patient was diagnosed with COVID-19 in June/July. She has had recent exposure to COVID at work. She lives with family. No sick contacts in the home. Patient's job requires her to obtain COVID testing prior to returning to work.   The following portions of the patient's history were reviewed and updated as appropriate: allergies, current medications, past family history, past medical history, past social history, past surgical history and problem list.        Past Medical History:  Diagnosis Date  . Medical history non-contributory     Patient Active Problem List   Diagnosis Date Noted  . Implanon removal 10/06/2018  . Implanon insertion 10/06/2018  . Ovarian cyst 11/19/2017  . Language barrier; speaks Arabic 12/31/2014    Past Surgical History:  Procedure Laterality Date  . CESAREAN SECTION      OB History    Gravida  4   Para  4   Term  4   Preterm      AB      Living  4     SAB      TAB      Ectopic       Multiple  0   Live Births  4            Home Medications    Prior to Admission medications   Medication Sig Start Date End Date Taking? Authorizing Provider  butalbital-acetaminophen-caffeine (FIORICET) 50-325-40 MG tablet Take 1-2 tablets by mouth every 6 (six) hours as needed for headache. 02/05/19 02/05/20  Enrique Sack, FNP  fluticasone (FLONASE) 50 MCG/ACT nasal spray Place 1 spray into both nostrils daily. 09/02/18   Loura Halt A, NP  cetirizine (ZYRTEC) 10 MG tablet Take 1 tablet (10 mg total) by mouth daily. Patient not taking: Reported on 10/06/2018 09/02/18 02/05/19  Orvan July, NP    Family History Family History  Problem Relation Age of Onset  . Diabetes Father   . Hypertension Father     Social History Social History   Tobacco Use  . Smoking status: Never Smoker  . Smokeless tobacco: Never Used  Substance Use Topics  . Alcohol use: No  . Drug use: No     Allergies   Patient has no known allergies.   Review of Systems Review of Systems  Constitutional: Negative.   HENT: Negative.   Respiratory: Negative.   Cardiovascular: Negative.   Gastrointestinal: Negative.   Genitourinary: Negative.   Musculoskeletal: Negative.   Skin: Negative.   Neurological: Positive for headaches. Negative for dizziness,  weakness, light-headedness and numbness.  All other systems reviewed and are negative.    Physical Exam Triage Vital Signs ED Triage Vitals  Enc Vitals Group     BP 02/05/19 1541 119/68     Pulse Rate 02/05/19 1541 79     Resp 02/05/19 1541 16     Temp 02/05/19 1541 98.2 F (36.8 C)     Temp Source 02/05/19 1541 Oral     SpO2 02/05/19 1541 100 %     Weight 02/05/19 1550 150 lb (68 kg)     Height --      Head Circumference --      Peak Flow --      Pain Score 02/05/19 1550 4     Pain Loc --      Pain Edu? --      Excl. in [place]? --    No data found.  Updated Vital Signs BP 119/68 (BP Location: Right Arm)   Pulse 79   Temp 98.2  F (36.8 C) (Oral)   Resp 16   Wt 150 lb (68 kg)   SpO2 100%   BMI 27.44 kg/m   Visual Acuity Right Eye Distance:   Left Eye Distance:   Bilateral Distance:    Right Eye Near:   Left Eye Near:    Bilateral Near:     Physical Exam Vitals signs reviewed.  Constitutional:      Appearance: She is well-developed.  HENT:     Head: Normocephalic.  Neck:     Musculoskeletal: Normal range of motion and neck supple. No neck rigidity.  Cardiovascular:     Rate and Rhythm: Normal rate and regular rhythm.  Pulmonary:     Effort: Pulmonary effort is normal.     Breath sounds: Normal breath sounds.  Musculoskeletal: Normal range of motion.  Skin:    General: Skin is warm and dry.     Findings: No rash.  Neurological:     Mental Status: She is alert.     Sensory: No sensory deficit.  Psychiatric:        Mood and Affect: Mood normal.      UC Treatments / Results  Labs (all labs ordered are listed, but only abnormal results are displayed) Labs Reviewed  NOVEL CORONAVIRUS, NAA (HOSP ORDER, SEND-OUT TO REF LAB; TAT 18-24 HRS)    EKG   Radiology No results found.  Procedures Procedures (including critical care time)  Medications Ordered in UC Medications - No data to display  Initial Impression / Assessment and Plan / UC Course  I have reviewed the triage vital signs and the nursing notes.  Pertinent labs & imaging results that were available during my care of the patient were reviewed by me and considered in my medical decision making (see chart for details).    43 yo female with history of COVID 19 back in June/July presents with a four-day history of headache. She denies any other symptoms such as muscle weakness, numbness of extremities, speech difficulties, vision problems, fevers, chills, myalgias, sore throat, cough, shortness of breath, nausea, vomiting, diarrhea or change in taste/smell. She has had recent exposure to COVID-19 at work. No sick contacts in the  home.   Plan:  Prescription medications: fioricet. Importance of adequate hydration discussed. Discussed lifestyle issues (diet, sleep, exercise). Patient advised to stay in home isolation until culture results received  Today's evaluation has revealed no signs of a dangerous process. Discussed diagnosis with patient and/or guardian. Patient and/or guardian  aware of their diagnosis, possible red flag symptoms to watch out for and need for close follow up. Patient and/or guardian understands verbal and written discharge instructions. Patient and/or guardian comfortable with plan and disposition.  Patient and/or guardian has a clear mental status at this time, good insight into illness (after discussion and teaching) and has clear judgment to make decisions regarding their care  This care was provided during an unprecedented National Emergency due to the Novel Coronavirus (COVID-19) pandemic. COVID-19 infections and transmission risks place heavy strains on healthcare resources.  As this pandemic evolves, our facility, providers, and staff strive to respond fluidly, to remain operational, and to provide care relative to available resources and information. Outcomes are unpredictable and treatments are without well-defined guidelines. Further, the impact of COVID-19 on all aspects of urgent care, including the impact to patients seeking care for reasons other than COVID-19, is unavoidable during this national emergency. At this time of the global pandemic, management of patients has significantly changed, even for non-COVID positive patients given high local and regional COVID volumes at this time requiring high healthcare system and resource utilization. The standard of care for management of both COVID suspected and non-COVID suspected patients continues to change rapidly at the local, regional, national, and global levels. This patient was worked up and treated to the best available but ever changing  evidence and resources available at this current time.   Documentation was completed with the aid of voice recognition software. Transcription may contain typographical errors.     Final Clinical Impressions(s) / UC Diagnoses   Final diagnoses:  Acute nonintractable headache, unspecified headache type  Close exposure to COVID-19 virus  History of 2019 novel coronavirus disease (COVID-19)     Discharge Instructions      1. Take medications as prescribed for headache  2. Supportive care with rest and fluids.  3. You should remain in home isolation (quarantine) until notified of your    COVID-19 results.  4. You should remain in home isolation for at least 10 days since symptoms started.  If you start to develop any serious symptoms such as chest pain or difficulty breathing, then call 911 or go to the emergency department. Please let them know immediately that you have been tested for COVID and waiting for results OR have tested positive for COVID-19.  5. You may discontinue home isolation under the following conditions: 1) At least 10 days* have passed since symptom onset; 2) At least 24 hours have passed since resolution of fever (> 100.4) without the use of fever-reducing medications; 3) Other symptoms have overall improve.  6. All three criteria must be met to discontinue isolation.  7. You will only be called if your COVID test is POSITIVE.  8. You should go to MyChart to review results of any testing done here today.  9. Monitor for any developing or worsening symptoms. If any serious symptoms develop, you should return here for evaluation, contact your health care provider or go to the nearest emergency department immediately.     ED Prescriptions    Medication Sig Dispense Auth. Provider   butalbital-acetaminophen-caffeine (FIORICET) 50-325-40 MG tablet Take 1-2 tablets by mouth every 6 (six) hours as needed for headache. 21 tablet Enrique Sack, FNP     PDMP not  reviewed this encounter.   Enrique Sack, [place] 02/05/19 [phone]

## 2019-02-05 NOTE — Discharge Instructions (Signed)
Take medications as prescribed for headache  Supportive care with rest and fluids.  You should remain in home isolation (quarantine) until notified of your    COVID-19 results.  You should remain in home isolation for at least 10 days since symptoms started.  If you start to develop any serious symptoms such as chest pain or difficulty breathing, then call 911 or go to the emergency department. Please let them know immediately that you have been tested for COVID and waiting for results OR have tested positive for COVID-19.  You may discontinue home isolation under the following conditions: 1) At least 10 days* have passed since symptom onset; 2) At least 24 hours have passed since resolution of fever (> 100.4) without the use of fever-reducing medications; 3) Other symptoms have overall improve.  All three criteria must be met to discontinue isolation.  You will only be called if your COVID test is POSITIVE.  You should go to MyChart to review results of any testing done here today.  Monitor for any developing or worsening symptoms. If any serious symptoms develop, you should return here for evaluation, contact your health care provider or go to the nearest emergency department immediately.

## 2019-02-06 LAB — NOVEL CORONAVIRUS, NAA (HOSP ORDER, SEND-OUT TO REF LAB; TAT 18-24 HRS): SARS-CoV-2, NAA: NOT DETECTED

## 2019-02-23 MED FILL — CIPROFLOXACIN HCL 250 MG TA: 250 | 7 days supply | Qty: 14 | Fill #0

## 2019-02-23 MED FILL — VIT D2 1.25 MG (50,000 UNIT: 1.25 MG | 28 days supply | Qty: 4 | Fill #0

## 2019-07-13 MED FILL — OMEPRAZOLE 40 MG CPDR: 40 | 30 days supply | Qty: 30 | Fill #0

## 2019-07-27 MED FILL — VIT D2 1.25 MG (50,000 UNIT: 1.25 MG | 4 days supply | Qty: 4 | Fill #0

## 2019-07-27 MED FILL — OMEPRAZOLE 40 MG CPDR: 40 | 30 days supply | Qty: 30 | Fill #0

## 2019-08-28 ENCOUNTER — Emergency Department (HOSPITAL_BASED_OUTPATIENT_CLINIC_OR_DEPARTMENT_OTHER)
Admission: EM | Admit: 2019-08-28 | Discharge: 2019-08-28 | Disposition: A | Discharge status: Home / Self Care | Payer: BC Managed Care – PPO | Attending: Emergency Medicine | Admitting: Emergency Medicine

## 2019-08-28 ENCOUNTER — Encounter (HOSPITAL_BASED_OUTPATIENT_CLINIC_OR_DEPARTMENT_OTHER): Payer: Self-pay | Admitting: Emergency Medicine

## 2019-08-28 ENCOUNTER — Emergency Department (HOSPITAL_BASED_OUTPATIENT_CLINIC_OR_DEPARTMENT_OTHER): Payer: BC Managed Care – PPO

## 2019-08-28 ENCOUNTER — Other Ambulatory Visit: Payer: Self-pay

## 2019-08-28 DIAGNOSIS — R112 Nausea with vomiting, unspecified: Secondary | ICD-10-CM | POA: Diagnosis not present

## 2019-08-28 DIAGNOSIS — R1013 Epigastric pain: Secondary | ICD-10-CM | POA: Diagnosis not present

## 2019-08-28 LAB — COMPREHENSIVE METABOLIC PANEL
ALT: 15 U/L (ref 0–44)
AST: 17 U/L (ref 15–41)
Albumin: 3.6 g/dL (ref 3.5–5.0)
Alkaline Phosphatase: 71 U/L (ref 38–126)
Anion gap: 10 (ref 5–15)
BUN: 9 mg/dL (ref 6–20)
CO2: 25 mmol/L (ref 22–32)
Calcium: 9.1 mg/dL (ref 8.9–10.3)
Chloride: 100 mmol/L (ref 98–111)
Creatinine, Ser: 0.55 mg/dL (ref 0.44–1.00)
GFR calc Af Amer: >60 mL/min (ref >60)
GFR calc non Af Amer: >60 mL/min (ref >60)
Glucose, Bld: 161 mg/dL — ABNORMAL HIGH (ref 70–99)
Potassium: 3.3 mmol/L — ABNORMAL LOW (ref 3.5–5.1)
Sodium: 135 mmol/L (ref 135–145)
Total Bilirubin: 0.3 mg/dL (ref 0.3–1.2)
Total Protein: 7.2 g/dL (ref 6.5–8.1)

## 2019-08-28 LAB — URINALYSIS, ROUTINE W REFLEX MICROSCOPIC
Bilirubin Urine: NEGATIVE
Glucose, UA: NEGATIVE mg/dL
Ketones, ur: 15 mg/dL — AB
Leukocytes,Ua: NEGATIVE
Nitrite: NEGATIVE
Protein, ur: NEGATIVE mg/dL
Specific Gravity, Urine: >1.03 — ABNORMAL HIGH (ref 1.005–1.030)
pH: 6 (ref 5.0–8.0)

## 2019-08-28 LAB — CBC WITH DIFFERENTIAL/PLATELET
Abs Immature Granulocytes: 0.02 10*3/uL (ref 0.00–0.07)
Basophils Absolute: 0 10*3/uL (ref 0.0–0.1)
Basophils Relative: 0 %
Eosinophils Absolute: 0 10*3/uL (ref 0.0–0.5)
Eosinophils Relative: 0 %
HCT: 36.1 % (ref 36.0–46.0)
Hemoglobin: 11.6 g/dL — ABNORMAL LOW (ref 12.0–15.0)
Immature Granulocytes: 0 %
Lymphocytes Relative: 18 %
Lymphs Abs: 1.6 10*3/uL (ref 0.7–4.0)
MCH: 26 pg (ref 26.0–34.0)
MCHC: 32.1 g/dL (ref 30.0–36.0)
MCV: 80.9 fL (ref 80.0–100.0)
Monocytes Absolute: 0.4 10*3/uL (ref 0.1–1.0)
Monocytes Relative: 5 %
Neutro Abs: 6.6 10*3/uL (ref 1.7–7.7)
Neutrophils Relative %: 77 %
Platelets: 259 10*3/uL (ref 150–400)
RBC: 4.46 MIL/uL (ref 3.87–5.11)
RDW: 13 % (ref 11.5–15.5)
WBC: 8.6 10*3/uL (ref 4.0–10.5)
nRBC: 0 % (ref 0.0–0.2)

## 2019-08-28 LAB — URINALYSIS, MICROSCOPIC (REFLEX)

## 2019-08-28 LAB — PREGNANCY, URINE: Preg Test, Ur: NEGATIVE

## 2019-08-28 LAB — LIPASE, BLOOD: Lipase: 28 U/L (ref 11–51)

## 2019-08-28 MED ORDER — HYOSCYAMINE SULFATE 0.125 MG SL SUBL
0.1250 mg | SUBLINGUAL_TABLET | Freq: Once | SUBLINGUAL | Status: AC
  Administered 2019-08-28: 0.125 mg via SUBLINGUAL
  Filled 2019-08-28: qty 1

## 2019-08-28 MED ORDER — HYOSCYAMINE SULFATE 0.125 MG PO TBDP: 0.1250 mg | ORAL_TABLET | ORAL | 0 refills | Status: DC | PRN

## 2019-08-28 MED ORDER — SUCRALFATE 1 GM/10ML PO SUSP
ORAL | Status: AC
  Filled 2019-08-28: qty 10

## 2019-08-28 MED ORDER — FENTANYL CITRATE (PF) 100 MCG/2ML IJ SOLN
50.0000 ug | Freq: Once | INTRAMUSCULAR | Status: AC
  Administered 2019-08-28: 50 ug via INTRAVENOUS
  Filled 2019-08-28: qty 2

## 2019-08-28 MED ORDER — SODIUM CHLORIDE 0.9 % IV BOLUS
1000.0000 mL | Freq: Once | INTRAVENOUS | Status: AC
  Administered 2019-08-28: 1000 mL via INTRAVENOUS

## 2019-08-28 MED ORDER — ONDANSETRON HCL 4 MG/2ML IJ SOLN
4.0000 mg | Freq: Once | INTRAMUSCULAR | Status: AC
  Administered 2019-08-28: 4 mg via INTRAVENOUS
  Filled 2019-08-28: qty 2

## 2019-08-28 MED ORDER — SUCRALFATE 1 GM/10ML PO SUSP
1.0000 g | Freq: Three times a day (TID) | ORAL | 1 refills | Status: DC
Start: 2019-08-28 — End: 2021-11-21

## 2019-08-28 MED ORDER — IOHEXOL 300 MG/ML  SOLN
100.0000 mL | Freq: Once | INTRAMUSCULAR | Status: AC | PRN
  Administered 2019-08-28: 100 mL via INTRAVENOUS

## 2019-08-28 MED ORDER — SUCRALFATE 1 GM/10ML PO SUSP
1.0000 g | Freq: Once | ORAL | Status: AC
  Administered 2019-08-28: 1 g via ORAL
  Filled 2019-08-28: qty 10

## 2019-08-28 MED ORDER — OMEPRAZOLE 20 MG PO CPDR
20.0000 mg | DELAYED_RELEASE_CAPSULE | Freq: Two times a day (BID) | ORAL | 1 refills | Status: DC
Start: 2019-08-28 — End: 2021-11-21

## 2019-08-28 MED ORDER — SUCRALFATE 1 GM/10ML PO SUSP
1.0000 g | Freq: Three times a day (TID) | ORAL | 1 refills | Status: DC
Start: 2019-08-28 — End: 2019-08-28

## 2019-08-28 MED ORDER — PANTOPRAZOLE SODIUM 40 MG IV SOLR
40.0000 mg | Freq: Once | INTRAVENOUS | Status: AC
  Administered 2019-08-28: 40 mg via INTRAVENOUS
  Filled 2019-08-28: qty 40

## 2019-08-28 MED ORDER — FENTANYL CITRATE (PF) 100 MCG/2ML IJ SOLN
50.0000 ug | INTRAMUSCULAR | Status: DC | PRN
  Administered 2019-08-28: 50 ug via INTRAVENOUS
  Filled 2019-08-28: qty 2

## 2019-08-28 MED ORDER — OMEPRAZOLE 20 MG PO CPDR
20.0000 mg | DELAYED_RELEASE_CAPSULE | Freq: Two times a day (BID) | ORAL | 1 refills | Status: DC
Start: 2019-08-28 — End: 2019-08-28

## 2019-08-28 MED ORDER — SUCRALFATE 1 GM/10ML PO SUSP: 1.0000 g | Freq: Once | ORAL | Status: DC

## 2019-08-28 MED FILL — SUCRALFATE 1 GM/10ML SUSP: 1 | 11 days supply | Qty: 420 | Fill #0

## 2019-08-28 MED FILL — OMEPRAZOLE 20 MG CAP: 20 | 30 days supply | Qty: 60 | Fill #0

## 2019-08-28 MED FILL — HYOSCYAMINE 0.125 MG ODT: 0.125 | 5 days supply | Qty: 30 | Fill #0 | Status: TO

## 2019-08-28 MED FILL — HYOSCYAMINE 0.125 MG ODT: 0.125 | 5 days supply | Qty: 30 | Fill #0

## 2019-08-28 NOTE — ED Triage Notes (Addendum)
Pt reports epigastric pain x1 week. Denies sick contacts, N/V. LMP 7 days ago. Family at bedside, speaks arabic.

## 2019-08-28 NOTE — ED Provider Notes (Signed)
[facility] Provider Note: Lowella Dell, MD, FACEP  CSN: [id number] MRN: [id number] ARRIVAL: 08/28/19 at 0407 ROOM: MH07/[facility]07   CHIEF COMPLAINT  Abdominal Pain   HISTORY OF PRESENT ILLNESS  08/28/19 4:45 AM Nancy Wright is a 44 y.o. female complains of epigastric pain since about 11 PM yesterday evening.  She cannot characterize the pain other than to say it is a 10 out of 10.  It is worse with palpation or movement.  She has had associated nausea and vomiting but not severely.  She denies dysuria, hematuria or diarrhea.  She has had similar pain in the past and was given an unspecified medication by her doctor which she states has not done any good.   Past Medical History:  Diagnosis Date  . Medical history non-contributory     Past Surgical History:  Procedure Laterality Date  . CESAREAN SECTION      Family History  Problem Relation Age of Onset  . Diabetes Father   . Hypertension Father     Social History   Tobacco Use  . Smoking status: Never Smoker  . Smokeless tobacco: Never Used  Substance Use Topics  . Alcohol use: No  . Drug use: No    Prior to Admission medications   Medication Sig Start Date End Date Taking? Authorizing Provider  butalbital-acetaminophen-caffeine (FIORICET) 50-325-40 MG tablet Take 1-2 tablets by mouth every 6 (six) hours as needed for headache. 02/05/19 02/05/20  Lurline Idol, FNP  fluticasone (FLONASE) 50 MCG/ACT nasal spray Place 1 spray into both nostrils daily. 09/02/18   Dahlia Byes A, NP  cetirizine (ZYRTEC) 10 MG tablet Take 1 tablet (10 mg total) by mouth daily. Patient not taking: Reported on 10/06/2018 09/02/18 02/05/19  Janace Aris, NP    Allergies Patient has no known allergies.   REVIEW OF SYSTEMS  Negative except as noted here or in the History of Present Illness.   PHYSICAL EXAMINATION  Initial Vital Signs Blood pressure 128/73, pulse 85, temperature 98.3 F (36.8 C), temperature source Oral, resp.  rate 18, weight 55 kg, last menstrual period 08/21/2019, SpO2 98 %, unknown if currently breastfeeding.  Examination General: Well-developed, thin female in no acute distress; appearance consistent with age of record HENT: normocephalic; atraumatic Eyes: pupils equal, round and reactive to light; extraocular muscles intact Neck: supple Heart: regular rate and rhythm Lungs: clear to auscultation bilaterally Abdomen: soft; nondistended; epigastric tenderness; negative Murphy sign; no masses or hepatosplenomegaly; bowel sounds present Extremities: No deformity; full range of motion; pulses normal Neurologic: Awake, alert; motor function intact in all extremities and symmetric; no facial droop Skin: Warm and dry Psychiatric: Tearful   RESULTS  Summary of this visit's results, reviewed and interpreted by myself:   EKG Interpretation  Date/Time:  Friday August 28 2019 04:15:51 EDT Ventricular Rate:  83 PR Interval:    QRS Duration: 101 QT Interval:  368 QTC Calculation: 433 R Axis:   72 Text Interpretation: Sinus rhythm RSR' in V1 or V2, right VCD or RVH Baseline wander in lead(s) V5 No previous ECGs available Confirmed by Chelby Salata, Jonny Ruiz ([id number]) on 08/28/2019 4:19:50 AM      Laboratory Studies: Results for orders placed or performed during the hospital encounter of 08/28/19 (from the past 24 hour(s))  Urinalysis, Routine w reflex microscopic     Status: Abnormal   Collection Time: 08/28/19  5:08 AM  Result Value Ref Range   Color, Urine YELLOW YELLOW   APPearance CLEAR CLEAR   Specific  Gravity, Urine >1.030 (H) 1.005 - 1.030   pH 6.0 5.0 - 8.0   Glucose, UA NEGATIVE NEGATIVE mg/dL   Hgb urine dipstick SMALL (A) NEGATIVE   Bilirubin Urine NEGATIVE NEGATIVE   Ketones, ur 15 (A) NEGATIVE mg/dL   Protein, ur NEGATIVE NEGATIVE mg/dL   Nitrite NEGATIVE NEGATIVE   Leukocytes,Ua NEGATIVE NEGATIVE  Pregnancy, urine     Status: None   Collection Time: 08/28/19  5:08 AM  Result Value Ref  Range   Preg Test, Ur NEGATIVE NEGATIVE  Urinalysis, Microscopic (reflex)     Status: Abnormal   Collection Time: 08/28/19  5:08 AM  Result Value Ref Range   RBC / HPF 0-5 0 - 5 RBC/hpf   WBC, UA 0-5 0 - 5 WBC/hpf   Bacteria, UA FEW (A) NONE SEEN   Squamous Epithelial / LPF 0-5 0 - 5   Mucus PRESENT   CBC with Differential/Platelet     Status: Abnormal   Collection Time: 08/28/19  5:32 AM  Result Value Ref Range   WBC 8.6 4.0 - 10.5 K/uL   RBC 4.46 3.87 - 5.11 MIL/uL   Hemoglobin 11.6 (L) 12.0 - 15.0 g/dL   HCT 87.5 64.3 - 32.9 %   MCV 80.9 80.0 - 100.0 fL   MCH 26.0 26.0 - 34.0 pg   MCHC 32.1 30.0 - 36.0 g/dL   RDW 51.8 84.1 - 66.0 %   Platelets 259 150 - 400 K/uL   nRBC 0.0 0.0 - 0.2 %   Neutrophils Relative % 77 %   Neutro Abs 6.6 1.7 - 7.7 K/uL   Lymphocytes Relative 18 %   Lymphs Abs 1.6 0.7 - 4.0 K/uL   Monocytes Relative 5 %   Monocytes Absolute 0.4 0.1 - 1.0 K/uL   Eosinophils Relative 0 %   Eosinophils Absolute 0.0 0.0 - 0.5 K/uL   Basophils Relative 0 %   Basophils Absolute 0.0 0.0 - 0.1 K/uL   Immature Granulocytes 0 %   Abs Immature Granulocytes 0.02 0.00 - 0.07 K/uL  Comprehensive metabolic panel     Status: Abnormal   Collection Time: 08/28/19  5:32 AM  Result Value Ref Range   Sodium 135 135 - 145 mmol/L   Potassium 3.3 (L) 3.5 - 5.1 mmol/L   Chloride 100 98 - 111 mmol/L   CO2 25 22 - 32 mmol/L   Glucose, Bld 161 (H) 70 - 99 mg/dL   BUN 9 6 - 20 mg/dL   Creatinine, Ser 6.30 0.44 - 1.00 mg/dL   Calcium 9.1 8.9 - 16.0 mg/dL   Total Protein 7.2 6.5 - 8.1 g/dL   Albumin 3.6 3.5 - 5.0 g/dL   AST 17 15 - 41 U/L   ALT 15 0 - 44 U/L   Alkaline Phosphatase 71 38 - 126 U/L   Total Bilirubin 0.3 0.3 - 1.2 mg/dL   GFR calc non Af Amer >60 >60 mL/min   GFR calc Af Amer >60 >60 mL/min   Anion gap 10 5 - 15  Lipase, blood     Status: None   Collection Time: 08/28/19  5:32 AM  Result Value Ref Range   Lipase 28 11 - 51 U/L   Imaging Studies: No results  found.  ED COURSE and MDM  Nursing notes, initial and subsequent vitals signs, including pulse oximetry, reviewed and interpreted by myself.  Vitals:   08/28/19 0418 08/28/19 0419 08/28/19 0625  BP: 128/73  115/63  Pulse: 85  87  Resp: 18  18  Temp: 98.3 F (36.8 C)    TempSrc: Oral    SpO2: 98%  100%  Weight:  55 kg    Medications  fentaNYL (SUBLIMAZE) injection 50 mcg (50 mcg Intravenous Given 08/28/19 0637)  ondansetron (ZOFRAN) injection 4 mg (4 mg Intravenous Given 08/28/19 0530)  pantoprazole (PROTONIX) injection 40 mg (40 mg Intravenous Given 08/28/19 0531)  fentaNYL (SUBLIMAZE) injection 50 mcg (50 mcg Intravenous Given 08/28/19 0531)  sodium chloride 0.9 % bolus 1,000 mL (1,000 mLs Intravenous New Bag/Given 08/28/19 0552)  sucralfate (CARAFATE) 1 GM/10ML suspension 1 g (1 g Oral Given 08/28/19 0607)  iohexol (OMNIPAQUE) 300 MG/ML solution 100 mL (100 mLs Intravenous Contrast Given 08/28/19 0659)   6:59 AM Patient still having pain.  No relief with oral Carafate.  CT of the abdomen and pelvis pending.  Dr. Vallery Ridge will follow up for results and make disposition.   PROCEDURES  Procedures   ED DIAGNOSES     ICD-10-CM   1. Epigastric pain  R10.13        Jessalynn Mccowan, Jenny Reichmann, MD 08/28/19 [phone]

## 2019-08-28 NOTE — Discharge Instructions (Signed)
1.  Take omeprazole twice daily as prescribed.  Take your tablet in the morning 30 minutes before you eat anything.  And eat a low-fat, bland small breakfast. 2.  Take Carafate as prescribed for the first week of treatment.  After that, you may take as needed for abdominal burning or pain. 3.  Follow the diet instructions for gallbladder and gastroesophageal reflux disease. 4.  Schedule an appointment with your family doctor within the next week.  They will need to evaluate your response to treatment and determine if you need referral to a gastroenterologist for further testing such as an upper endoscopy and or HIDA scan. 5.  Return to the emergency department if your symptoms are worsening, or new concerning symptoms develop.

## 2019-08-28 NOTE — ED Provider Notes (Signed)
Patient has had epigastric pain sporadically.  She does note specific foods can make it much worse.  Pain is severe and localized within the epigastrium.  It does not radiate into the chest. Physical Exam  BP 115/63 (BP Location: Left Arm)   Pulse 87   Temp 98.3 F (36.8 C) (Oral)   Resp 18   Wt 55 kg   LMP 08/21/2019   SpO2 100%   BMI 22.18 kg/m   Physical Exam Patient is alert and nontoxic.  She does have focal discomfort to palpation in the epigastrium.  No guarding in the right upper quadrant. ED Course/Procedures     Procedures  MDM  CT scan has returned negative.  I have gone over extensive instructions and descriptions regarding differential diagnosis and plan for empiric treatment of GERD\peptic ulcer disease\possible biliary colic without stones.  Patient is to start treatment of omeprazole twice daily with Carafate for a week.  Also prescribed hyoscyamine for as needed use.  Instructions given to follow-up with PCP for reevaluation and determine if referral to GI for endoscopy or HIDA is indicated.       Arby Barrette, MD 08/28/19 [phone]

## 2019-08-28 NOTE — ED Notes (Signed)
Pt at CT

## 2019-09-01 MED FILL — BUTALB-ACETAMIN-CAFF 50-325: 50-325-40 | 8 days supply | Qty: 30 | Fill #0

## 2019-09-01 MED FILL — AMITRIPTYLINE HCL 25 MG TAB: 25 | 30 days supply | Qty: 30 | Fill #0

## 2019-12-03 IMAGING — US US PELVIS COMPLETE TRANSABD/TRANSVAG
1 series · 15 of 25 positions shown · non-contrast
Comparison: None

CLINICAL DATA: Abnormal uterine bleeding



[Series 1: us pelvis complete transabd/transvag · 76 acquisitions, 15 frames shown]
[im 1/76]
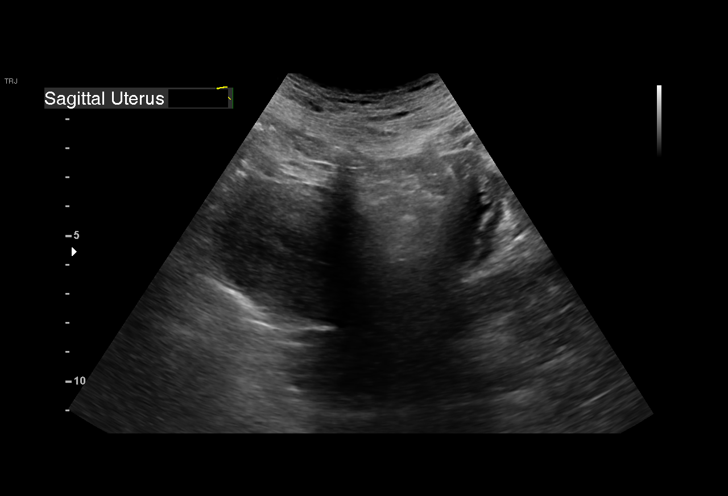
[im 7/76]
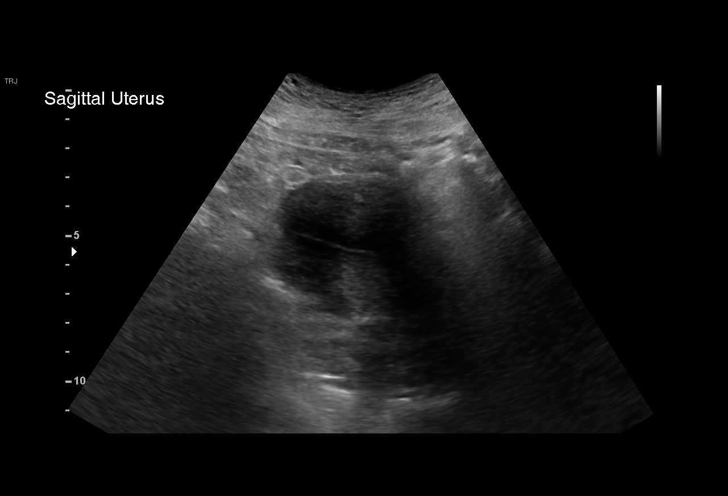
[im 13/76]
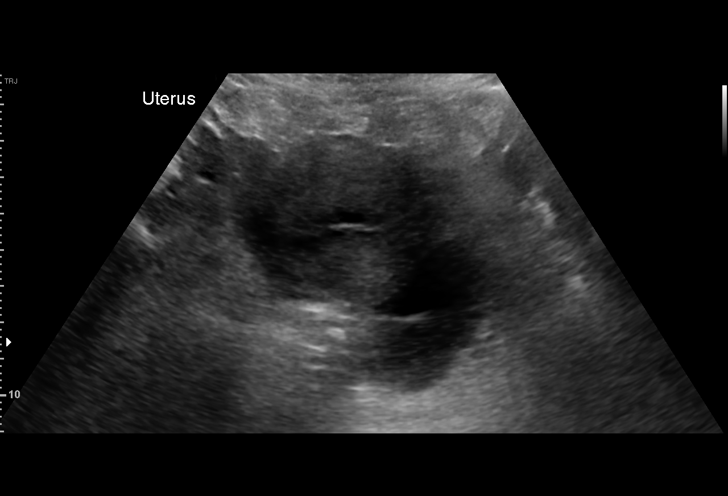
[im 16/76]
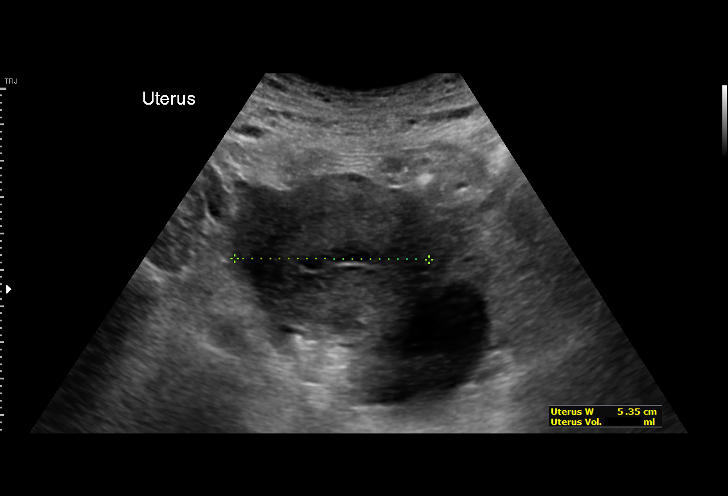
[im 22/76]
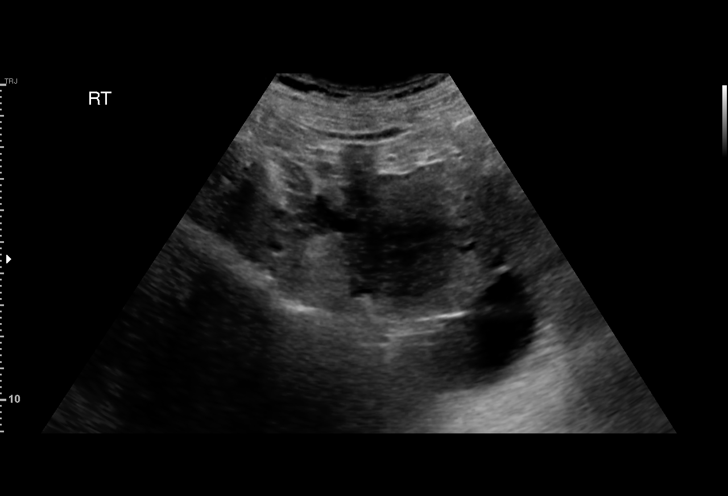
[im 29/76]
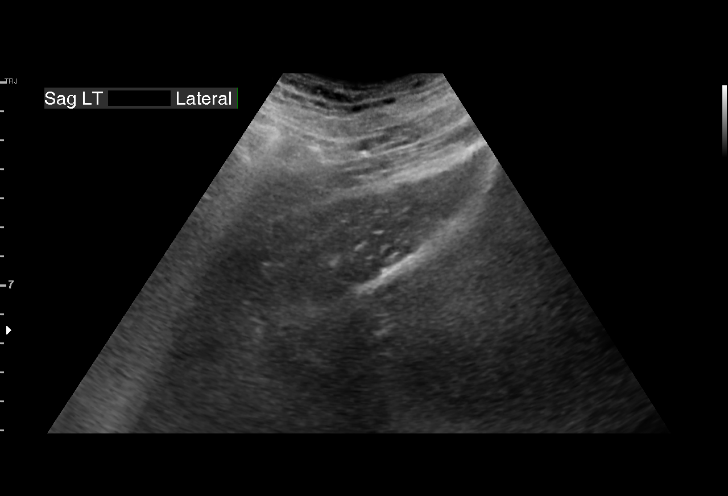
[im 32/76]
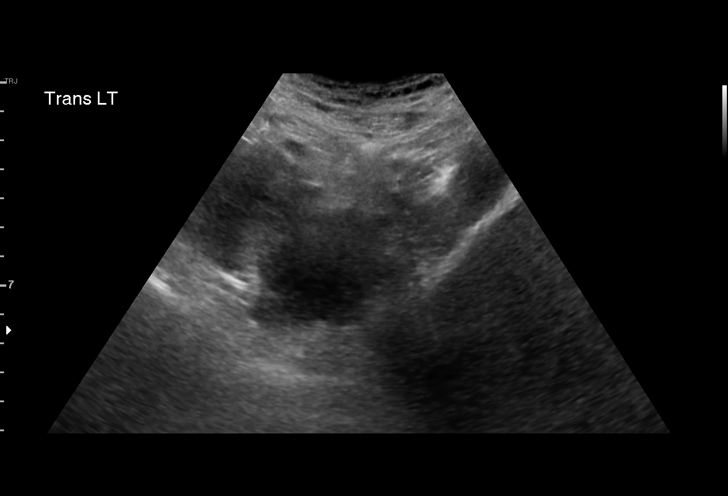
[im 38/76]
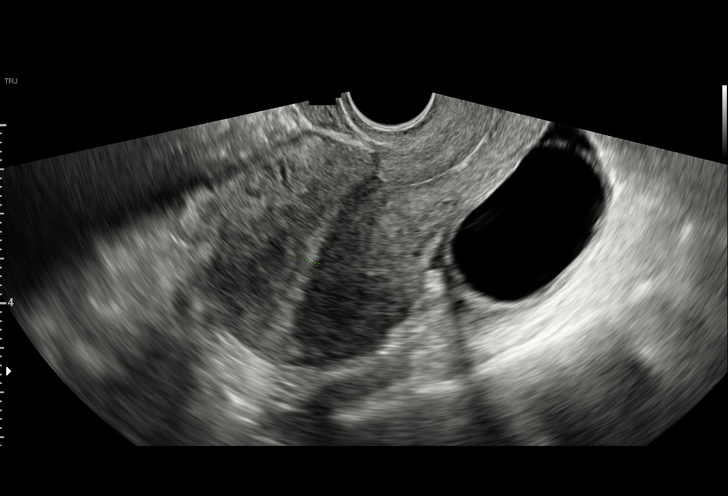
[im 44/76]
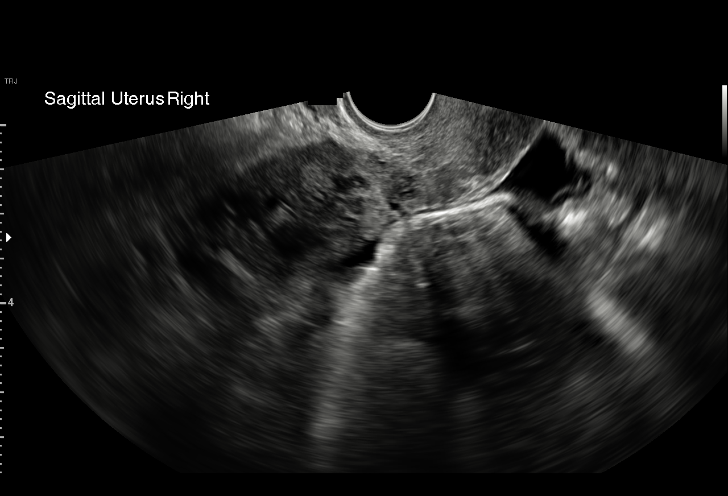
[im 47/76]
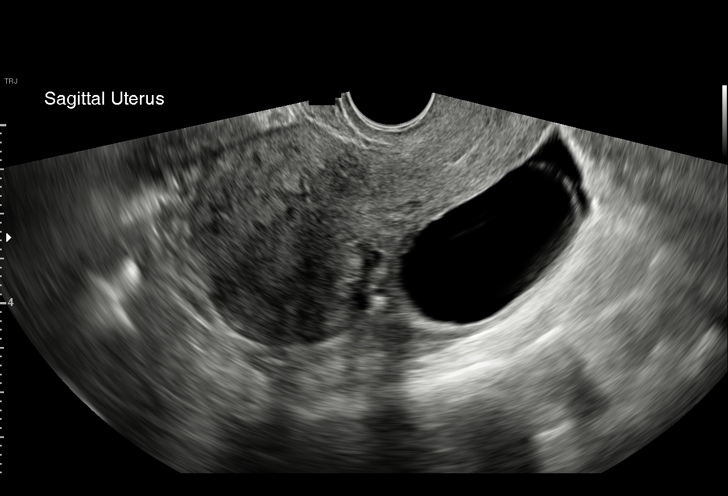
[im 54/76]
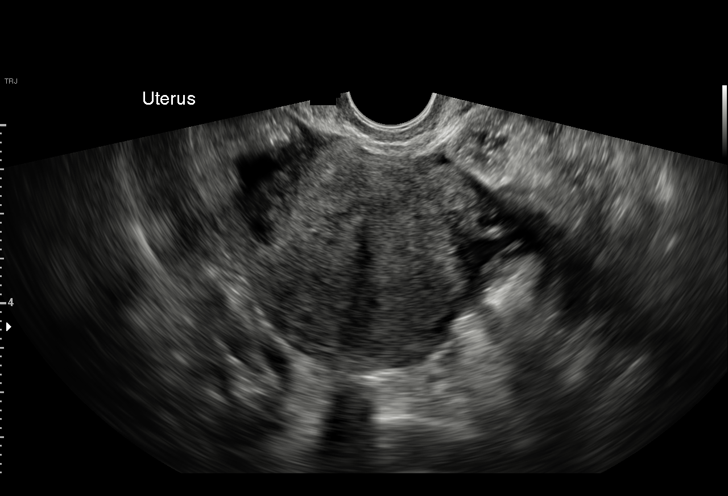
[im 60/76]
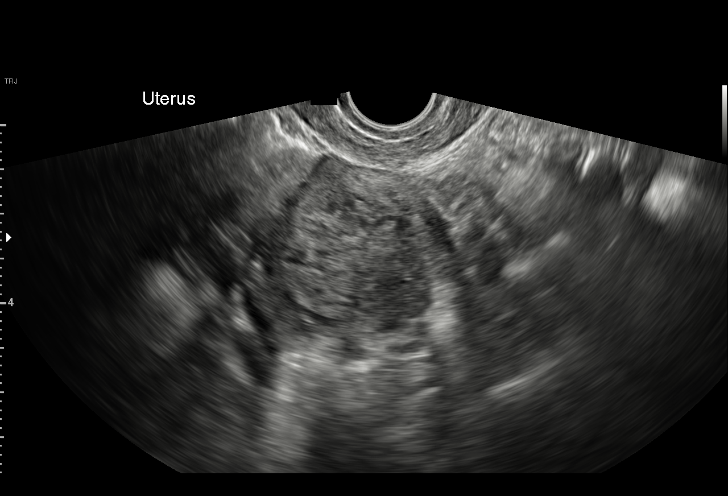
[im 63/76]
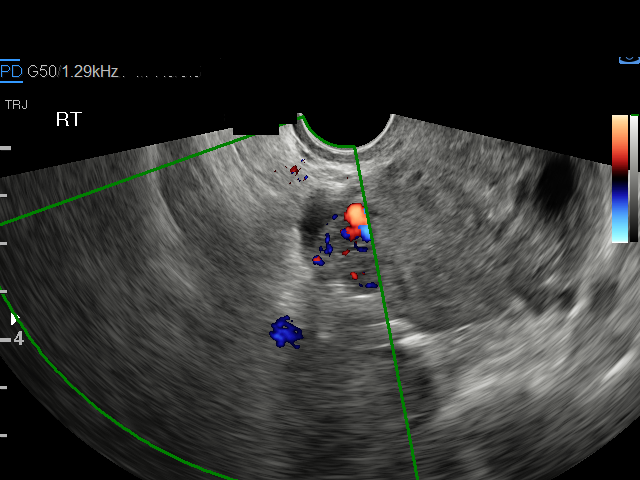
[im 69/76]
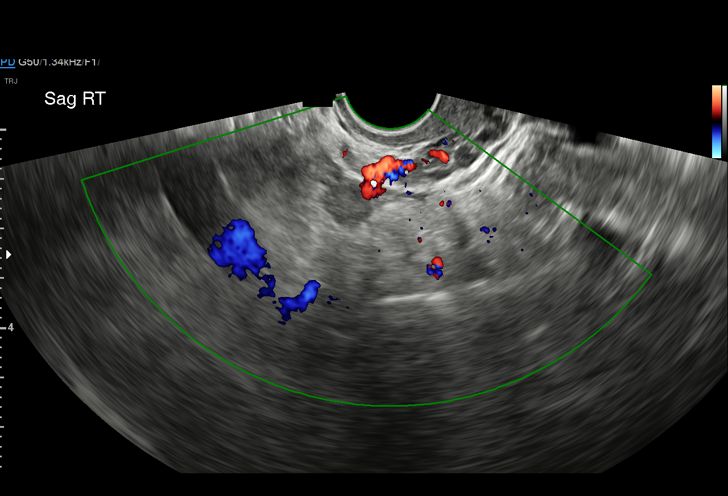
[im 76/76]
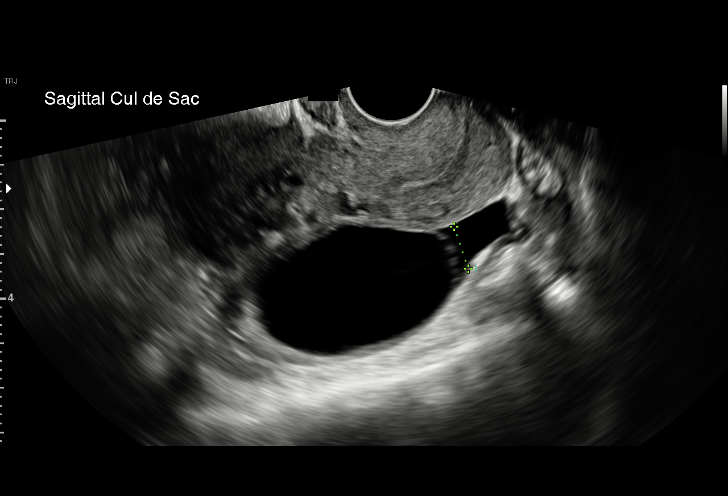

[15 of 25 positions shown; findings below may reference images not displayed]

FINDINGS: Uterus

Measurements: 9.0 x 4.5 x 5.0 cm. Heterogeneous echotexture. No
focal fibroid.

Endometrium

Thickness: 5 mm in thickness.  No focal abnormality visualized.

Right ovary

Measurements: Not visualized.  No adnexal mass seen.

Left ovary

Measurements: 5.9 x 3.1 x 3.8 cm.  5.1 cm simple appearing cyst.

Other findings

Small amount of free fluid.
IMPRESSION: Heterogeneous echotexture throughout the uterus which can be seen
with adenomyosis. Endometrium normal in thickness and appearance.

5.1 cm simple appearing left ovarian cyst. This is almost certainly
benign, but follow up ultrasound is recommended in 1 year according
to the Society of Radiologists in QltrasoundUO3O Consensus
Conference Statement (Totonho Zubek et al. Management of Asymptomatic
Ovarian and Other Adnexal Cysts Imaged at US: Society of
Radiologists in Ultrasound Consensus Conference Statement 5898.
Radiology [DATE]): [id number].).

For

## 2020-02-09 ENCOUNTER — Other Ambulatory Visit (HOSPITAL_COMMUNITY): Payer: Self-pay | Admitting: Internal Medicine

## 2020-02-09 MED FILL — VIT D2 1.25 MG (50,000 UNIT: 1.25 MG | 28 days supply | Qty: 4 | Fill #0

## 2020-02-09 MED FILL — IBUPROFEN 800 MG TABS: 800 | 15 days supply | Qty: 30 | Fill #0

## 2020-02-09 MED FILL — BUTALB-ACETAMIN-CAFF 50-325: 50-325-40 | 7 days supply | Qty: 30 | Fill #0

## 2020-02-09 MED FILL — AMITRIPTYLINE HCL 25 MG TAB: 25 | 30 days supply | Qty: 30 | Fill #0

## 2020-03-28 ENCOUNTER — Other Ambulatory Visit (HOSPITAL_COMMUNITY): Payer: Self-pay | Admitting: Emergency Medicine

## 2020-03-28 MED FILL — PENICILLIN VK 500 MG TABLET: 500 | 10 days supply | Qty: 40 | Fill #0

## 2020-04-05 MED FILL — VIT D2 1.25 MG (50,000 UNIT: 1.25 MG | 28 days supply | Qty: 4 | Fill #1

## 2021-09-10 IMAGING — CT CT ABD-PELV W/ CM
2 of 5 series · 16 of 46 positions shown, 18 images · IV contrast (Omnipaque)
Comparison: None similar

CLINICAL DATA: Epigastric pain for 1 week

EXAM:
CT ABDOMEN AND PELVIS WITH CONTRAST
TECHNIQUE: Multidetector CT imaging of the abdomen and pelvis was performed
using the standard protocol following bolus administration of
intravenous contrast.
CONTRAST:  100mL OMNIPAQUE IOHEXOL 300 MG/ML  SOLN

[Series 2: axial st · axial · 0.65mm/px · z∈[-416,-1]mm · 13 of 93 slices shown, 15 images]
[im 5/93  soft-tissue]
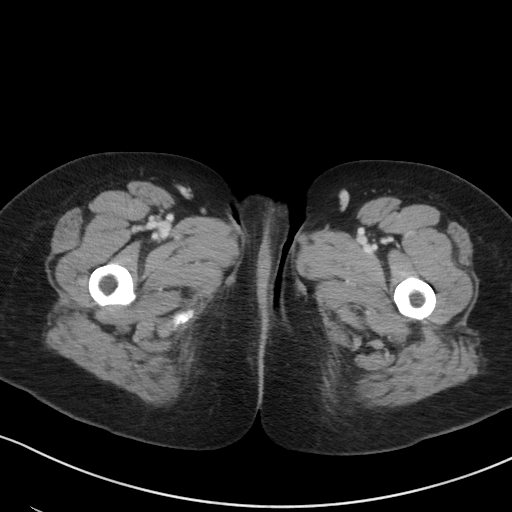
[im 5/93  bone]
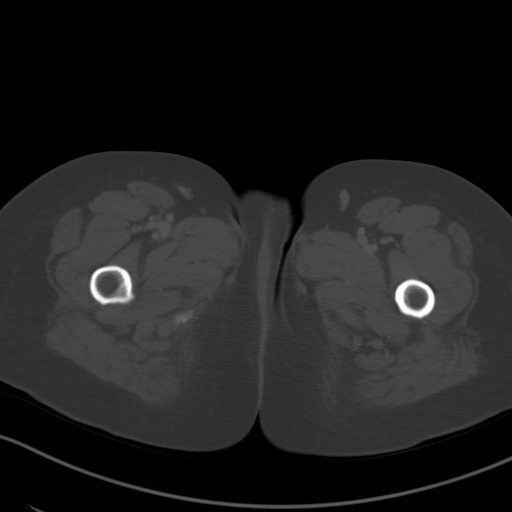
[im 14/93  soft-tissue]
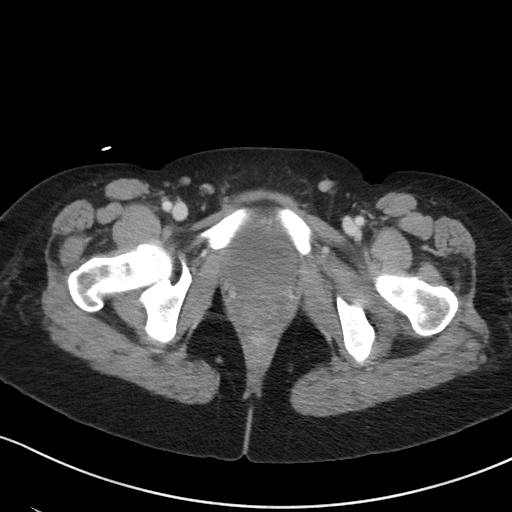
[im 19/93  soft-tissue]
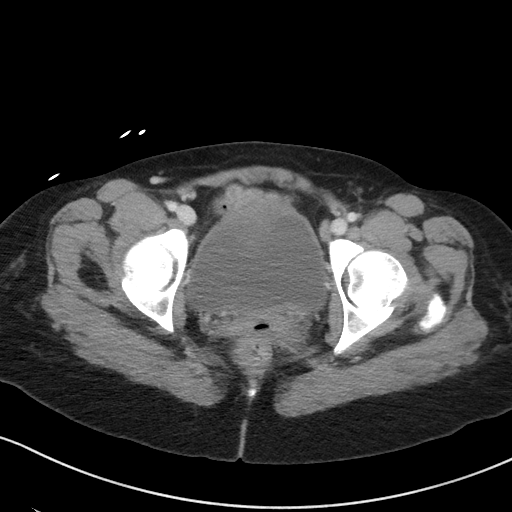
[im 28/93  soft-tissue]
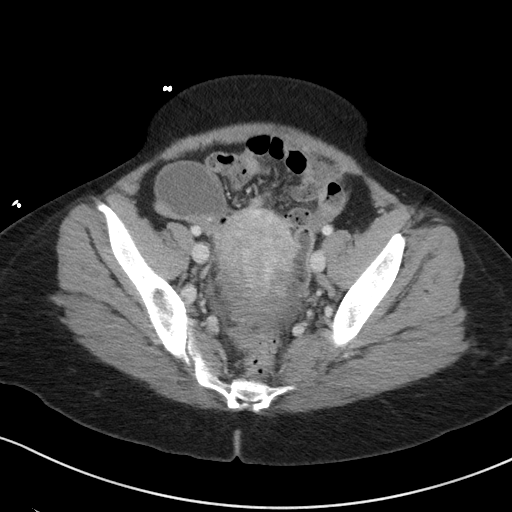
[im 33/93  soft-tissue]
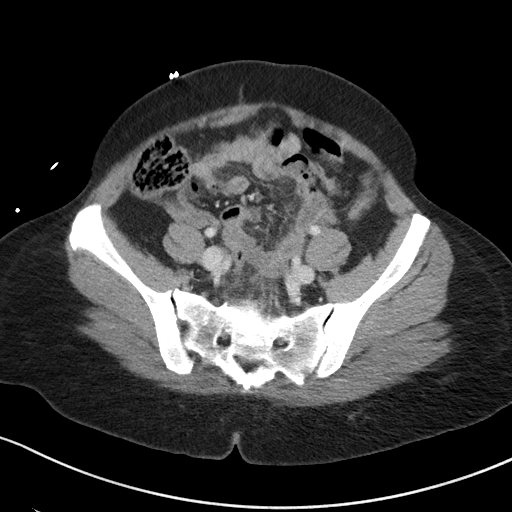
[im 42/93  soft-tissue]
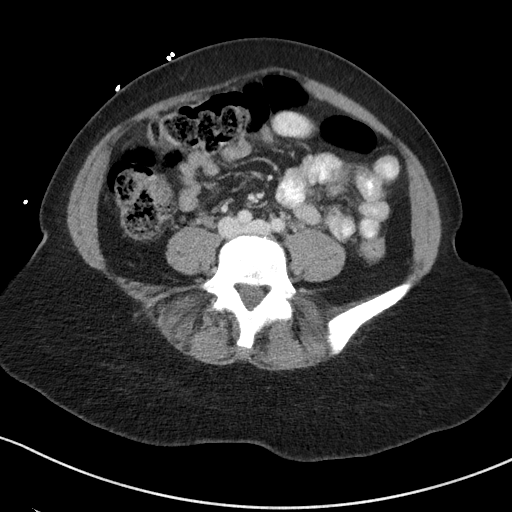
[im 47/93  soft-tissue]
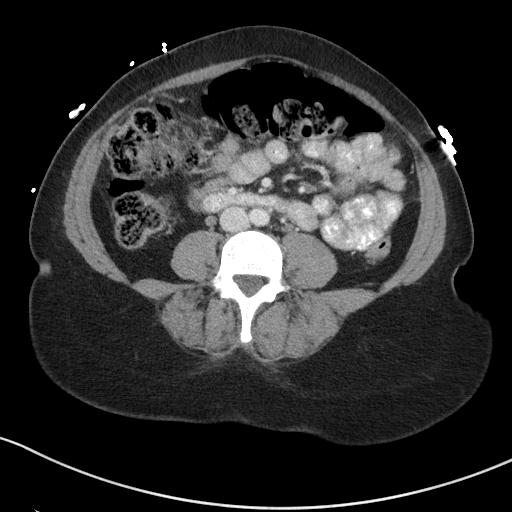
[im 51/93  soft-tissue]
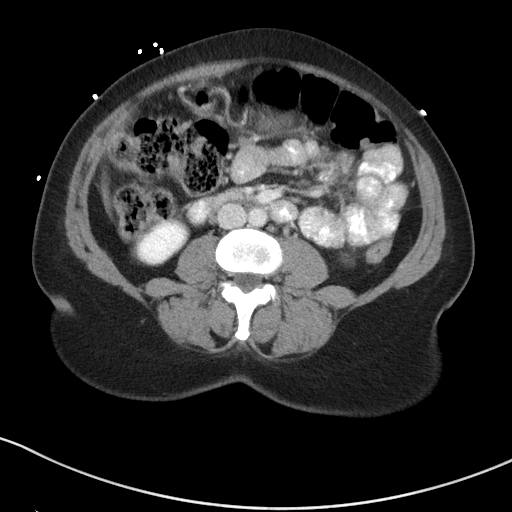
[im 60/93  soft-tissue]
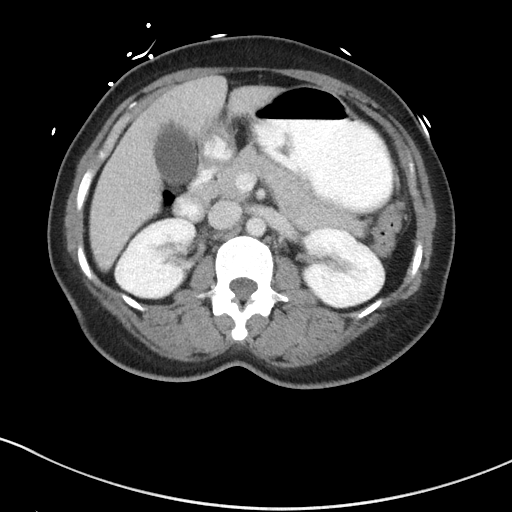
[im 60/93  bone]
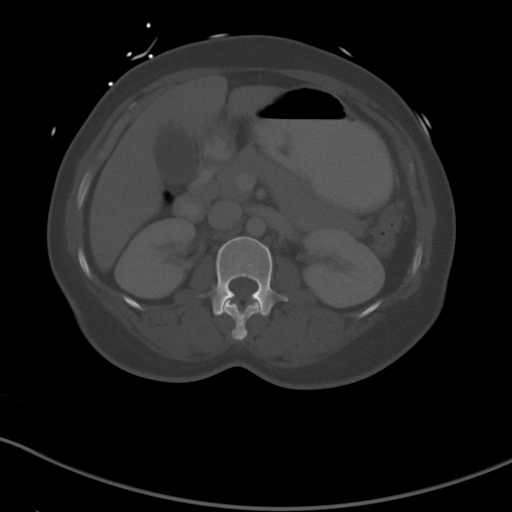
[im 65/93  soft-tissue]
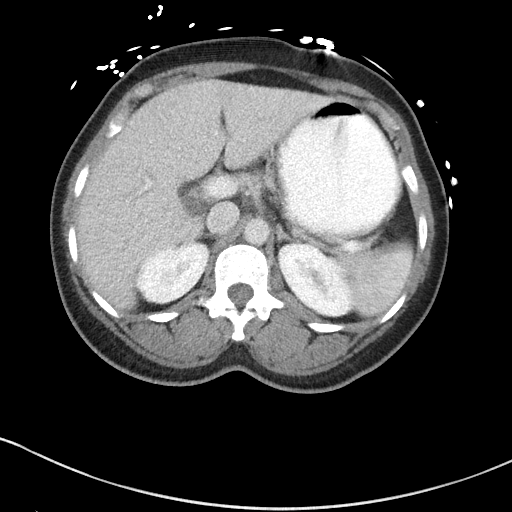
[im 74/93  soft-tissue]
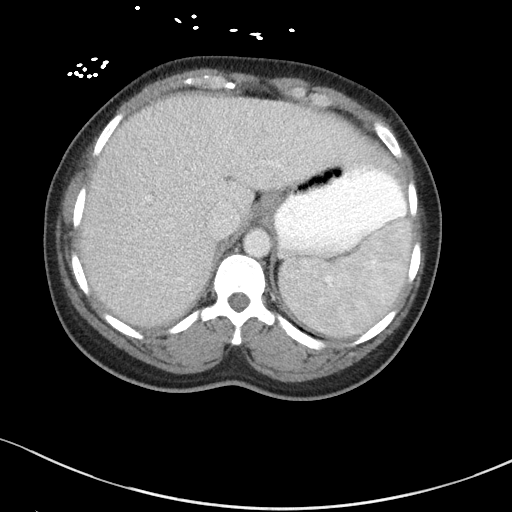
[im 79/93  soft-tissue]
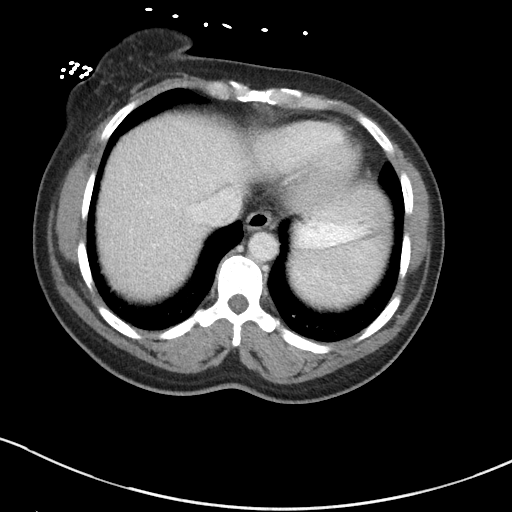
[im 88/93  soft-tissue]
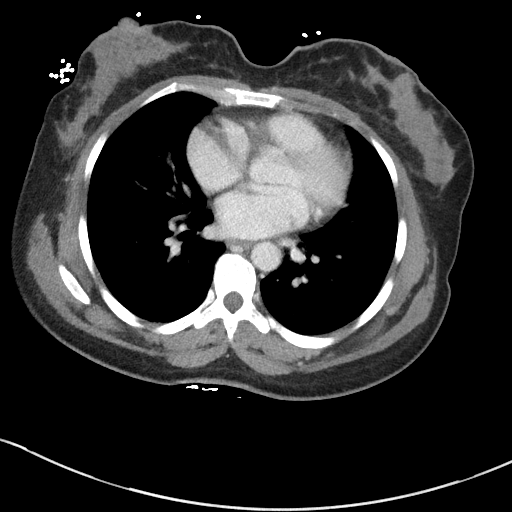

[Series 5: coronal st · coronal · 0.65mm/px · 3 of 72 slices shown]
[im 24/72  soft-tissue]
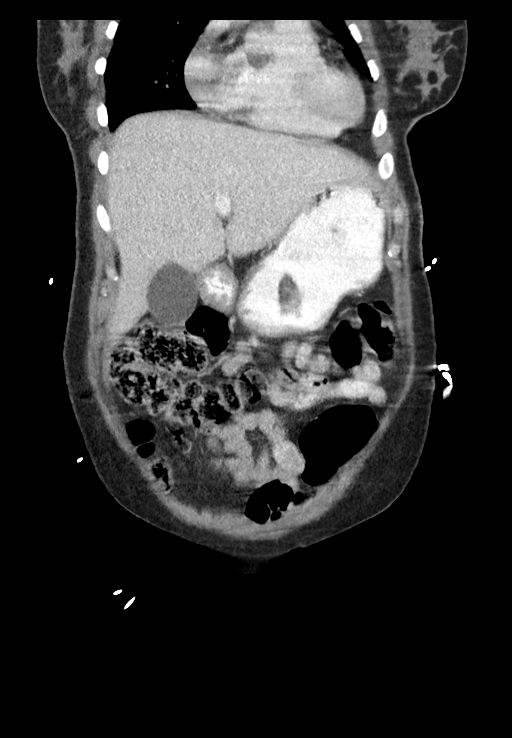
[im 32/72  soft-tissue]
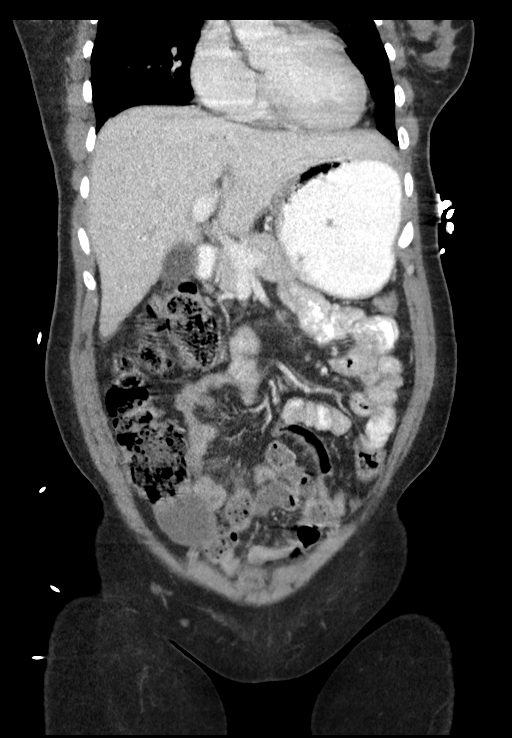
[im 40/72  soft-tissue]
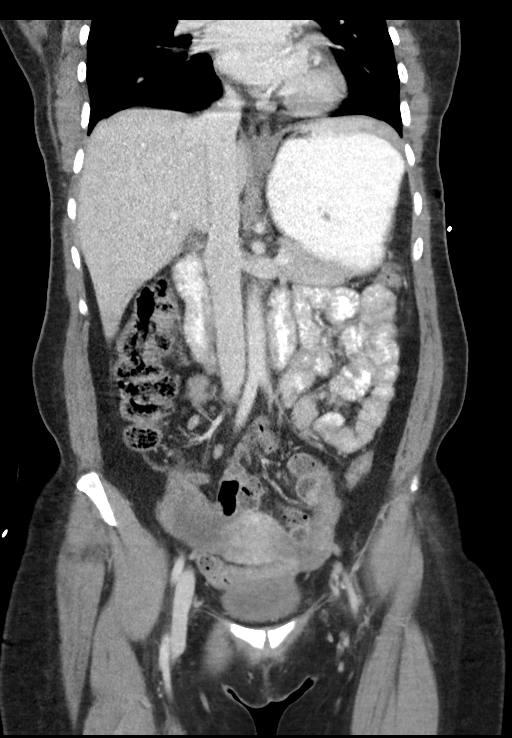

[16 of 46 positions shown; findings below may reference images not displayed]

FINDINGS: Lower chest:  No contributory findings.

Hepatobiliary: No focal liver abnormality.No evidence of biliary
obstruction or stone.

Pancreas: Unremarkable.

Spleen: Unremarkable.

Adrenals/Urinary Tract: Negative adrenals. No hydronephrosis or
stone. Small right renal cystic density. Unremarkable bladder.

Stomach/Bowel:  No obstruction. No appendicitis.

Vascular/Lymphatic: No acute vascular abnormality. No mass or
adenopathy.

Reproductive:4 cm right ovarian cyst with simple CT appearance,
usually physiologic at this size. The adjacent ovary is not
thickened.

Other: No ascites or pneumoperitoneum.

Musculoskeletal: No acute abnormalities.
IMPRESSION: No acute finding or explanation for pain.

## 2021-10-20 DIAGNOSIS — E8809 Other disorders of plasma-protein metabolism, not elsewhere classified: Secondary | ICD-10-CM | POA: Insufficient documentation

## 2021-10-20 DIAGNOSIS — R109 Unspecified abdominal pain: Secondary | ICD-10-CM | POA: Insufficient documentation

## 2021-10-20 DIAGNOSIS — N920 Excessive and frequent menstruation with regular cycle: Secondary | ICD-10-CM | POA: Insufficient documentation

## 2021-10-20 DIAGNOSIS — R42 Dizziness and giddiness: Secondary | ICD-10-CM | POA: Insufficient documentation

## 2021-10-24 DIAGNOSIS — D509 Iron deficiency anemia, unspecified: Secondary | ICD-10-CM | POA: Insufficient documentation

## 2021-10-26 ENCOUNTER — Inpatient Hospital Stay: Payer: BC Managed Care – PPO | Admitting: Hematology & Oncology

## 2021-10-26 ENCOUNTER — Inpatient Hospital Stay: Payer: BC Managed Care – PPO | Attending: Nurse Practitioner

## 2021-10-30 ENCOUNTER — Other Ambulatory Visit: Payer: Self-pay | Admitting: Family

## 2021-10-30 DIAGNOSIS — D649 Anemia, unspecified: Secondary | ICD-10-CM

## 2021-10-30 DIAGNOSIS — A048 Other specified bacterial intestinal infections: Secondary | ICD-10-CM | POA: Insufficient documentation

## 2021-11-01 ENCOUNTER — Inpatient Hospital Stay (HOSPITAL_BASED_OUTPATIENT_CLINIC_OR_DEPARTMENT_OTHER): Payer: BC Managed Care – PPO | Admitting: Family

## 2021-11-01 ENCOUNTER — Inpatient Hospital Stay: Payer: BC Managed Care – PPO | Attending: Hematology & Oncology

## 2021-11-01 ENCOUNTER — Telehealth: Payer: Self-pay | Admitting: *Deleted

## 2021-11-01 ENCOUNTER — Encounter: Payer: Self-pay | Admitting: Family

## 2021-11-01 VITALS — BP 122/70 | HR 70 | Temp 97.8°F | Resp 17 | Ht 63.0 in | Wt 134.1 lb

## 2021-11-01 DIAGNOSIS — R519 Headache, unspecified: Secondary | ICD-10-CM | POA: Diagnosis not present

## 2021-11-01 DIAGNOSIS — Z79899 Other long term (current) drug therapy: Secondary | ICD-10-CM | POA: Diagnosis not present

## 2021-11-01 DIAGNOSIS — N920 Excessive and frequent menstruation with regular cycle: Secondary | ICD-10-CM | POA: Insufficient documentation

## 2021-11-01 DIAGNOSIS — R5383 Other fatigue: Secondary | ICD-10-CM | POA: Diagnosis not present

## 2021-11-01 DIAGNOSIS — R110 Nausea: Secondary | ICD-10-CM

## 2021-11-01 DIAGNOSIS — D649 Anemia, unspecified: Secondary | ICD-10-CM

## 2021-11-01 DIAGNOSIS — D500 Iron deficiency anemia secondary to blood loss (chronic): Secondary | ICD-10-CM | POA: Diagnosis not present

## 2021-11-01 LAB — IRON AND IRON BINDING CAPACITY (CC-WL,HP ONLY)
Iron: 23 ug/dL — ABNORMAL LOW (ref 28–170)
Saturation Ratios: 4 % — ABNORMAL LOW (ref 10.4–31.8)
TIBC: 557 ug/dL — ABNORMAL HIGH (ref 250–450)
UIBC: 534 ug/dL — ABNORMAL HIGH (ref 148–442)

## 2021-11-01 LAB — CBC WITH DIFFERENTIAL (CANCER CENTER ONLY)
Abs Immature Granulocytes: 0.01 10*3/uL (ref 0.00–0.07)
Basophils Absolute: 0 10*3/uL (ref 0.0–0.1)
Basophils Relative: 1 %
Eosinophils Absolute: 0.1 10*3/uL (ref 0.0–0.5)
Eosinophils Relative: 2 %
HCT: 37.4 % (ref 36.0–46.0)
Hemoglobin: 11.8 g/dL — ABNORMAL LOW (ref 12.0–15.0)
Immature Granulocytes: 0 %
Lymphocytes Relative: 50 %
Lymphs Abs: 2.8 10*3/uL (ref 0.7–4.0)
MCH: 24.8 pg — ABNORMAL LOW (ref 26.0–34.0)
MCHC: 31.6 g/dL (ref 30.0–36.0)
MCV: 78.6 fL — ABNORMAL LOW (ref 80.0–100.0)
Monocytes Absolute: 0.6 10*3/uL (ref 0.1–1.0)
Monocytes Relative: 10 %
Neutro Abs: 2.1 10*3/uL (ref 1.7–7.7)
Neutrophils Relative %: 37 %
Platelet Count: 240 10*3/uL (ref 150–400)
RBC: 4.76 MIL/uL (ref 3.87–5.11)
RDW: 14.1 % (ref 11.5–15.5)
WBC Count: 5.7 10*3/uL (ref 4.0–10.5)
nRBC: 0 % (ref 0.0–0.2)

## 2021-11-01 LAB — CMP (CANCER CENTER ONLY)
ALT: 9 U/L (ref 0–44)
AST: 11 U/L — ABNORMAL LOW (ref 15–41)
Albumin: 3.8 g/dL (ref 3.5–5.0)
Alkaline Phosphatase: 65 U/L (ref 38–126)
Anion gap: 7 (ref 5–15)
BUN: 7 mg/dL (ref 6–20)
CO2: 28 mmol/L (ref 22–32)
Calcium: 9.1 mg/dL (ref 8.9–10.3)
Chloride: 104 mmol/L (ref 98–111)
Creatinine: 0.58 mg/dL (ref 0.44–1.00)
GFR, Estimated: >60 mL/min (ref >60)
Glucose, Bld: 104 mg/dL — ABNORMAL HIGH (ref 70–99)
Potassium: 3.7 mmol/L (ref 3.5–5.1)
Sodium: 139 mmol/L (ref 135–145)
Total Bilirubin: 0.1 mg/dL — ABNORMAL LOW (ref 0.3–1.2)
Total Protein: 7 g/dL (ref 6.5–8.1)

## 2021-11-01 LAB — LACTATE DEHYDROGENASE: LDH: 125 U/L (ref 98–192)

## 2021-11-01 LAB — RETICULOCYTES
Immature Retic Fract: 11 % (ref 2.3–15.9)
RBC.: 4.75 MIL/uL (ref 3.87–5.11)
Retic Count, Absolute: 63.7 10*3/uL (ref 19.0–186.0)
Retic Ct Pct: 1.3 % (ref 0.4–3.1)

## 2021-11-01 LAB — FERRITIN: Ferritin: 2 ng/mL — ABNORMAL LOW (ref 11–307)

## 2021-11-01 LAB — SAVE SMEAR(SSMR), FOR PROVIDER SLIDE REVIEW

## 2021-11-01 NOTE — Progress Notes (Signed)
Hematology/Oncology Consultation   Name: [name]      MRN: [id number]    Location: Room/bed info not found  Date: 11/01/2021 Time:9:43 AM   REFERRING PHYSICIAN:  Catalina Gravel, NP   REASON FOR CONSULT: Iron deficiency anemia    DIAGNOSIS: Iron deficiency anemia secondary to heavy cycles  HISTORY OF PRESENT ILLNESS: Ms. Nancy Wright is a very pleasant 46 yo Arabic female with iron deficiency anemia. Interpretor present as well.  She states that her cycles is very heavy lasting up to 1 month at a time.  She has not noted any other blood loss. No abnormal bruising, no petechiae.  She states that she has an appointment this month with gynecology as well as for a TVUS.  No known family history of anemia.  Last month her iron saturation was only 5%.  She is symptomatic with fatigue, headaches, nausea without vomiting, numbness and tingling in her hands and feet and at times she feels that her legs are "heavy".  No swelling or tenderness in her extremities.  No falls or syncope to report.  Only surgical history is a previous C section.  She has 4 children and no history of miscarriage.  No history of diabetes or thyroid disease.  No fever, chills, cough, rash, SOB, chest pain, palpitations or changes in bowel or bladder habits.  She states that with certain foods such as lemon juice, fatty and spicy foods she will have some abdominal discomfort.  No smoking, ETOH or recreational drug use.  Appetite is decreased. She is vegetarian. She feels that she is staying well hydrated. Her weight is stable at 134 lbs.   ROS: All other 10 point review of systems is negative.   PAST MEDICAL HISTORY:   Past Medical History:  Diagnosis Date   Medical history non-contributory     ALLERGIES: No Known Allergies    MEDICATIONS:  Current Outpatient Medications on File Prior to Visit  Medication Sig Dispense Refill   amitriptyline (ELAVIL) 25 MG tablet TAKE 1 TABLET BY MOUTH AT BEDTIME AS NEEDED FOR SLEEP 30  tablet 5   omeprazole (PRILOSEC) 20 MG capsule Take 1 capsule (20 mg total) by mouth 2 (two) times daily before a meal. 60 capsule 1   sucralfate (CARAFATE) 1 GM/10ML suspension Take 10 mLs (1 g total) by mouth 4 (four) times daily -  with meals and at bedtime. 420 mL 1   fluticasone (FLONASE) 50 MCG/ACT nasal spray Place 1 spray into both nostrils daily. (Patient not taking: Reported on 11/01/2021) 16 g 2   hyoscyamine (ANASPAZ) 0.125 MG TBDP disintergrating tablet Place 1 tablet (0.125 mg total) under the tongue every 4 (four) hours as needed. (Patient not taking: Reported on 11/01/2021) 30 tablet 0   [DISCONTINUED] cetirizine (ZYRTEC) 10 MG tablet Take 1 tablet (10 mg total) by mouth daily. (Patient not taking: Reported on 10/06/2018) 30 tablet 0   No current facility-administered medications on file prior to visit.     PAST SURGICAL HISTORY Past Surgical History:  Procedure Laterality Date   CESAREAN SECTION      FAMILY HISTORY: Family History  Problem Relation Age of Onset   Diabetes Father    Hypertension Father     SOCIAL HISTORY:  reports that she has never smoked. She has never used smokeless tobacco. She reports that she does not drink alcohol and does not use drugs.  PERFORMANCE STATUS: The patient's performance status is 1 - Symptomatic but completely ambulatory  PHYSICAL EXAM: Most Recent Vital  Signs: Blood pressure 122/70, pulse 70, temperature 97.8 F (36.6 C), temperature source Oral, resp. rate 17, height 5\' 3"  (1.6 m), weight 134 lb 1.3 oz (60.8 kg), SpO2 100 %, unknown if currently breastfeeding. BP 122/70 (BP Location: Left Arm, Patient Position: Sitting)   Pulse 70   Temp 97.8 F (36.6 C) (Oral)   Resp 17   Ht 5\' 3"  (1.6 m)   Wt 134 lb 1.3 oz (60.8 kg)   SpO2 100%   BMI 23.75 kg/m   General Appearance:    Alert, cooperative, no distress, appears stated age  Head:    Normocephalic, without obvious abnormality, atraumatic  Eyes:    PERRL, conjunctiva/corneas  clear, EOM's intact, fundi    benign, both eyes        Throat:   Lips, mucosa, and tongue normal; teeth and gums normal  Neck:   Supple, symmetrical, trachea midline, no adenopathy;    thyroid:  no enlargement/tenderness/nodules; no carotid   bruit or JVD  Back:     Symmetric, no curvature, ROM normal, no CVA tenderness  Lungs:     Clear to auscultation bilaterally, respirations unlabored  Chest Wall:    No tenderness or deformity   Heart:    Regular rate and rhythm, S1 and S2 normal, no murmur, rub   or gallop     Abdomen:     Soft, non-tender, bowel sounds active all four quadrants,    no masses, no organomegaly        Extremities:   Extremities normal, atraumatic, no cyanosis or edema  Pulses:   2+ and symmetric all extremities  Skin:   Skin color, texture, turgor normal, no rashes or lesions  Lymph nodes:   Cervical, supraclavicular, and axillary nodes normal  Neurologic:   CNII-XII intact, normal strength, sensation and reflexes    throughout    LABORATORY DATA:  Results for orders placed or performed in visit on 11/01/21 (from the past 48 hour(s))  Lactate dehydrogenase (LDH)     Status: None   Collection Time: 11/01/21  8:40 AM  Result Value Ref Range   LDH 125 98 - 192 U/L    Comment: Performed at [facility] at [facility], [address], [place], [address] [place]  Save Smear [facility])     Status: None   Collection Time: 11/01/21  8:40 AM  Result Value Ref Range   Smear Review SMEAR STAINED AND AVAILABLE FOR REVIEW     Comment: Performed at [facility] at [facility], [address] [place], [address] [place]  CMP (Cancer Center only)     Status: Abnormal   Collection Time: 11/01/21  8:40 AM  Result Value Ref Range   Sodium 139 135 - 145 mmol/L   Potassium 3.7 3.5 - 5.1 mmol/L   Chloride 104 98 - 111 mmol/L   CO2 28 22 - 32 mmol/L   Glucose, Bld 104 (H) 70 - 99 mg/dL    Comment: Glucose reference range  applies only to samples taken after fasting for at least 8 hours.   BUN 7 6 - 20 mg/dL   Creatinine 72536 01/02/22 - 1.00 mg/dL   Calcium 9.1 8.9 - 6.44 mg/dL   Total Protein 7.0 6.5 - 8.1 g/dL   Albumin 3.8 3.5 - 5.0 g/dL   AST 11 (L) 15 - 41 U/L   ALT 9 0 - 44 U/L   Alkaline Phosphatase 65 38 -  126 U/L   Total Bilirubin 0.1 (L) 0.3 - 1.2 mg/dL   GFR, Estimated >10 >17 mL/min    Comment: (NOTE) Calculated using the CKD-EPI Creatinine Equation (2021)    Anion gap 7 5 - 15    Comment: Performed at [facility] Lab at [facility], [address] [place], [place] [address]  CBC with Differential (Cancer Center Only)     Status: Abnormal   Collection Time: 11/01/21  8:40 AM  Result Value Ref Range   WBC Count 5.7 4.0 - 10.5 K/uL   RBC 4.76 3.87 - 5.11 MIL/uL   Hemoglobin 11.8 (L) 12.0 - 15.0 g/dL   HCT 85.2 77.8 - 24.2 %   MCV 78.6 (L) 80.0 - 100.0 fL   MCH 24.8 (L) 26.0 - 34.0 pg   MCHC 31.6 30.0 - 36.0 g/dL   RDW 35.3 61.4 - 43.1 %   Platelet Count 240 150 - 400 K/uL   nRBC 0.0 0.0 - 0.2 %   Neutrophils Relative % 37 %   Neutro Abs 2.1 1.7 - 7.7 K/uL   Lymphocytes Relative 50 %   Lymphs Abs 2.8 0.7 - 4.0 K/uL   Monocytes Relative 10 %   Monocytes Absolute 0.6 0.1 - 1.0 K/uL   Eosinophils Relative 2 %   Eosinophils Absolute 0.1 0.0 - 0.5 K/uL   Basophils Relative 1 %   Basophils Absolute 0.0 0.0 - 0.1 K/uL   Immature Granulocytes 0 %   Abs Immature Granulocytes 0.01 0.00 - 0.07 K/uL    Comment: Performed at [facility] at [facility], [address], [place], [place] [address]  Reticulocytes     Status: None   Collection Time: 11/01/21  8:41 AM  Result Value Ref Range   Retic Ct Pct 1.3 0.4 - 3.1 %   RBC. 4.75 3.87 - 5.11 MIL/uL   Retic Count, Absolute 63.7 19.0 - 186.0 K/uL   Immature Retic Fract 11.0 2.3 - 15.9 %    Comment: Performed at [facility] Lab at [facility], [address] [place], [place] [address]      RADIOGRAPHY: No results found.     PATHOLOGY: None  ASSESSMENT/PLAN: Ms. Suell is a very pleasant 46 yo Arabic female with iron deficiency anemia secondary to heavy cycles.   Iron studies are pending. We will get her set up for IV iron starting this week if needed.  She will follow-up with gynecology this month and have TVUS.  Follow-up with our office in 8 weeks.   All questions were answered. The patient knows to call the clinic with any problems, questions or concerns. We can certainly see the patient much sooner if necessary.   Eileen Stanford, NP

## 2021-11-01 NOTE — Telephone Encounter (Signed)
Per 11/01/21 los - gave upcoming appointments - confirmed 

## 2021-11-03 LAB — ERYTHROPOIETIN: Erythropoietin: 12.4 m[IU]/mL (ref 2.6–18.5)

## 2021-11-10 ENCOUNTER — Inpatient Hospital Stay: Payer: BC Managed Care – PPO

## 2021-11-16 DIAGNOSIS — D509 Iron deficiency anemia, unspecified: Secondary | ICD-10-CM

## 2021-11-16 HISTORY — DX: Iron deficiency anemia, unspecified: D50.9

## 2021-11-17 ENCOUNTER — Inpatient Hospital Stay: Payer: BC Managed Care – PPO

## 2021-11-17 VITALS — BP 90/66 | HR 69 | Temp 98.4°F | Resp 17

## 2021-11-17 DIAGNOSIS — D500 Iron deficiency anemia secondary to blood loss (chronic): Secondary | ICD-10-CM | POA: Diagnosis not present

## 2021-11-17 MED ORDER — SODIUM CHLORIDE 0.9 % IV SOLN
300.0000 mg | Freq: Once | INTRAVENOUS | Status: AC
  Administered 2021-11-17: 300 mg via INTRAVENOUS
  Filled 2021-11-17: qty 300

## 2021-11-17 MED ORDER — SODIUM CHLORIDE 0.9 % IV SOLN: Freq: Once | INTRAVENOUS | Status: AC

## 2021-11-17 NOTE — Patient Instructions (Signed)

## 2021-11-21 ENCOUNTER — Ambulatory Visit (INDEPENDENT_AMBULATORY_CARE_PROVIDER_SITE_OTHER): Payer: BC Managed Care – PPO | Admitting: Family Medicine

## 2021-11-21 ENCOUNTER — Encounter: Payer: Self-pay | Admitting: Family Medicine

## 2021-11-21 VITALS — BP 100/57 | HR 73 | Ht 61.0 in | Wt 132.2 lb

## 2021-11-21 DIAGNOSIS — Z7689 Persons encountering health services in other specified circumstances: Secondary | ICD-10-CM | POA: Diagnosis not present

## 2021-11-21 DIAGNOSIS — D500 Iron deficiency anemia secondary to blood loss (chronic): Secondary | ICD-10-CM

## 2021-11-21 DIAGNOSIS — Z1211 Encounter for screening for malignant neoplasm of colon: Secondary | ICD-10-CM | POA: Diagnosis not present

## 2021-11-21 NOTE — Progress Notes (Signed)
New Patient Office Visit  Subjective    Patient ID: Nancy Wright, female    DOB: 1975-11-02  Age: 46 y.o. MRN: [id number]  CC: establish care   HPI Nancy Wright presents to establish care. She is here with in-person Arabic interpreter. No acute complaints/concerns today.    IDA (secondary to heavy menses) - Follows with Hem/Onc in our building for monitoring and iron infusions - She established with them 11/01/21 and had her first iron infusion on 11/17/21 with plans to follow with GYN for TVUS - Reports she can already tell that she feels a little bit better after first infusion - She still needs to make her GYN appointment for the [place] - states they will stop by their office today while interpreter is available     HM: She is open to referral for colonoscopy. She will get GYN to do her pap smear at upcoming appointment. Unsure of last one.       Outpatient Encounter Medications as of 11/21/2021  Medication Sig   [DISCONTINUED] amitriptyline (ELAVIL) 25 MG tablet TAKE 1 TABLET BY MOUTH AT BEDTIME AS NEEDED FOR SLEEP   [DISCONTINUED] cetirizine (ZYRTEC) 10 MG tablet Take 1 tablet (10 mg total) by mouth daily. (Patient not taking: Reported on 10/06/2018)   [DISCONTINUED] fluticasone (FLONASE) 50 MCG/ACT nasal spray Place 1 spray into both nostrils daily. (Patient not taking: Reported on 11/01/2021)   [DISCONTINUED] hyoscyamine (ANASPAZ) 0.125 MG TBDP disintergrating tablet Place 1 tablet (0.125 mg total) under the tongue every 4 (four) hours as needed. (Patient not taking: Reported on 11/01/2021)   [DISCONTINUED] omeprazole (PRILOSEC) 20 MG capsule Take 1 capsule (20 mg total) by mouth 2 (two) times daily before a meal.   [DISCONTINUED] sucralfate (CARAFATE) 1 GM/10ML suspension Take 10 mLs (1 g total) by mouth 4 (four) times daily -  with meals and at bedtime.   No facility-administered encounter medications on file as of 11/21/2021.    Past Medical History:  Diagnosis Date   Medical  history non-contributory     Past Surgical History:  Procedure Laterality Date   CESAREAN SECTION      Family History  Problem Relation Age of Onset   Diabetes Father    Hypertension Father     Social History   Socioeconomic History   Marital status: Married    Spouse name: Not on file   Number of children: Not on file   Years of education: Not on file   Highest education level: Not on file  Occupational History   Not on file  Tobacco Use   Smoking status: Never   Smokeless tobacco: Never  Substance and Sexual Activity   Alcohol use: No   Drug use: No   Sexual activity: Yes    Birth control/protection: Implant, Pill  Other Topics Concern   Not on file  Social History Narrative   Not on file   Social Determinants of Health   Financial Resource Strain: Not on file  Food Insecurity: Not on file  Transportation Needs: Not on file  Physical Activity: Not on file  Stress: Not on file  Social Connections: Not on file  Intimate Partner Violence: Not on file    ROS All review of systems negative except what is listed in the HPI      Objective    BP (!) 100/57   Pulse 73   Ht 5\' 1"  (1.549 m)   Wt 132 lb 3.2 oz (60 kg)   BMI 24.98 kg/m  Physical Exam Vitals reviewed.  Constitutional:      Appearance: Normal appearance.  Cardiovascular:     Rate and Rhythm: Normal rate and regular rhythm.  Pulmonary:     Effort: Pulmonary effort is normal.     Breath sounds: Normal breath sounds.  Musculoskeletal:     Cervical back: Normal range of motion and neck supple. No tenderness.     Right lower leg: No edema.     Left lower leg: No edema.  Lymphadenopathy:     Cervical: No cervical adenopathy.  Skin:    General: Skin is warm and dry.  Neurological:     General: No focal deficit present.     Mental Status: She is alert and oriented to person, place, and time. Mental status is at baseline.  Psychiatric:        Mood and Affect: Mood normal.         Behavior: Behavior normal.        Thought Content: Thought content normal.        Judgment: Judgment normal.         Assessment & Plan:   Problem List Items Addressed This Visit       Other   Iron deficiency anemia due to chronic blood loss - Primary VSS. Feeling good today. Continue following with Hem/Onc for iron infusions. She is planning to stop by GYN office today with interpreter to schedule appointment for evaluation of heavy menses.   Other Visit Diagnoses     Screen for colon cancer       Relevant Orders   Ambulatory referral to Gastroenterology   Encounter to establish care           Return in about 6 months (around 05/24/2022) for CPE.   Clayborne Dana, NP

## 2021-11-21 NOTE — Patient Instructions (Signed)
Thank you for choosing [facility] at [facility] for your Primary Care needs. I am excited for the opportunity to partner with you to meet your health care goals. It was a pleasure meeting you today!  Recommendations from today's visit: Keep upcoming specialist appointments.   Information on diet, exercise, and health maintenance recommendations are listed below. This is information to help you be sure you are on track for optimal health and monitoring.   Please look over this and let us know if you have any questions or if you have completed any of the health maintenance outside of [facility] so that we can be sure your records are up to date.  ___________________________________________________________  MyChart:  For all urgent or time sensitive needs we ask that you please call the office to avoid delays. Our number is [phone]. MyChart is not constantly monitored and due to the large volume of messages a day, replies may take up to 72 business hours.  MyChart Policy: MyChart allows for you to see your visit notes, after visit summary, provider recommendations, lab and tests results, make an appointment, request refills, and contact your provider or the office for non-urgent questions or concerns. Providers are seeing patients during normal business hours and do not have built in time to review MyChart messages.  We ask that you allow a minimum of 3 business days for responses to [employer]. For this reason, please do not send urgent requests through MyChart. Please call the office at [phone]. New and ongoing conditions may require a visit. We have virtual and in-person visits available for your convenience.  Complex MyChart concerns may require a visit. Your provider may request you schedule a virtual or in-person visit to ensure we are providing the best care possible. MyChart messages sent after 11:00 AM on Friday will not be received by the provider  until Monday morning.    Lab and Test Results: You will receive your lab and test results on MyChart as soon as they are completed and results have been sent by the lab or testing facility. Due to this service, you will receive your results BEFORE your provider.  I review lab and test results each morning prior to seeing patients. Some results require collaboration with other providers to ensure you are receiving the most appropriate care. For this reason, we ask that you please allow a minimum of 3-5 business days from the time that ALL results have been received for your provider to receive and review lab and test results and contact you about these.  Most lab and test result comments from the provider will be sent through MyChart. Your provider may recommend changes to the plan of care, follow-up visits, repeat testing, ask questions, or request an office visit to discuss these results. You may reply directly to this message or call the office to provide information for the provider or set up an appointment. In some instances, you will be called with test results and recommendations. Please let us know if this is preferred and we will make note of this in your chart to provide this for you.    If you have not heard a response to your lab or test results in 5 business days from all results returning to MyChart, please call the office to let us know. We ask that you please avoid calling prior to this time unless there is an emergent concern. Due to high call volumes, this can delay the resulting process.  After Hours: For all non-emergency after hours needs, please call the office at [phone] and select the option to reach the on-call  service. On-call services are shared between multiple [facility] offices and therefore it will not be possible to speak directly with your provider. On-call providers may provide medical advice and recommendations, but are unable to provide refills for maintenance  medications.  For all emergency or urgent medical needs after normal business hours, we recommend that you seek care at the closest Urgent Care or Emergency Department to ensure appropriate treatment in a timely manner.  [facility] [place] has a 24 hour emergency room located on the ground floor for your convenience.   Urgent Concerns During the Business Day Providers are seeing patients from 8AM to 5PM with a busy schedule and are most often not able to respond to non-urgent calls until the end of the day or the next business day. If you should have URGENT concerns during the day, please call and speak to the nurse or schedule a same day appointment so that we can address your concern without delay.   Thank you, again, for choosing me as your health care partner. I appreciate your trust and look forward to learning more about you.   Lollie Marrow Reola Calkins, DNP, FNP-C  ___________________________________________________________  Health Maintenance Recommendations Screening Testing Mammogram Every 1-2 years based on history and risk factors Starting at age 32 Pap Smear Ages 21-39 every 3 years Ages 47-65 every 5 years with HPV testing More frequent testing may be required based on results and history Colon Cancer Screening Every 1-10 years based on test performed, risk factors, and history Starting at age 69 Bone Density Screening Every 2-10 years based on history Starting at age 22 for women Recommendations for men differ based on medication usage, history, and risk factors AAA Screening One time ultrasound Men 71-16 years old who have ever smoked Lung Cancer Screening Low Dose Lung CT every 12 months Age 18-80 years with a 20 pack-year smoking history who still smoke or who have quit within the last 15 years  Screening Labs Routine  Labs: Complete Blood Count (CBC), Complete Metabolic Panel (CMP), Cholesterol (Lipid Panel) Every 6-12 months based on history and  medications May be recommended more frequently based on current conditions or previous results Hemoglobin A1c Lab Every 3-12 months based on history and previous results Starting at age 75 or earlier with diagnosis of diabetes, high cholesterol, BMI >26, and/or risk factors Frequent monitoring for patients with diabetes to ensure blood sugar control Thyroid Panel (TSH w/ T3 & T4) Every 6 months based on history, symptoms, and risk factors May be repeated more often if on medication HIV One time testing for all patients 29 and older May be repeated more frequently for patients with increased risk factors or exposure Hepatitis C One time testing for all patients 79 and older May be repeated more frequently for patients with increased risk factors or exposure Gonorrhea, Chlamydia Every 12 months for all sexually active persons 13-24 years Additional monitoring may be recommended for those who are considered high risk or who have symptoms PSA Men 2-74 years old with risk factors Additional screening may be recommended from age 28-69 based on risk factors, symptoms, and history  Vaccine Recommendations Tetanus Booster All adults every 10 years Flu Vaccine All patients 6 months and older every year COVID Vaccine All patients 12 years and older Initial dosing with booster May recommend additional booster based on age and  health history HPV Vaccine 2 doses all patients age 99-26 Dosing may be considered for patients over 26 Shingles Vaccine (Shingrix) 2 doses all adults 50 years and older Pneumonia (Pneumovax 23) All adults 65 years and older May recommend earlier dosing based on health history Pneumonia (Prevnar 49) All adults 65 years and older Dosed 1 year after Pneumovax 23 Pneumonia (Prevnar 20) All adults 65 years and older (adults 19-64 with certain conditions or risk factors) 1 dose  For those who have no received Prevnar 13 vaccine previously   Additional Screening,  Testing, and Vaccinations may be recommended on an individualized basis based on family history, health history, risk factors, and/or exposure.  __________________________________________________________  Diet Recommendations for All Patients  I recommend that all patients maintain a diet low in saturated fats, carbohydrates, and cholesterol. While this can be challenging at first, it is not impossible and small changes can make big differences.  Things to try: Decreasing the amount of soda, sweet tea, and/or juice to one or less per day and replace with water While water is always the first choice, if you do not like water you may consider adding a water additive without sugar to improve the taste other sugar free drinks Replace potatoes with a brightly colored vegetable at dinner Use healthy oils, such as canola oil or olive oil, instead of butter or hard margarine Limit your bread intake to two pieces or less a day Replace regular pasta with low carb pasta options Bake, broil, or grill foods instead of frying Monitor portion sizes  Eat smaller, more frequent meals throughout the day instead of large meals  An important thing to remember is, if you love foods that are not great for your health, you don't have to give them up completely. Instead, allow these foods to be a reward when you have done well. Allowing yourself to still have special treats every once in a while is a nice way to tell yourself thank you for working hard to keep yourself healthy.   Also remember that every day is a new day. If you have a bad day and "fall off the wagon", you can still climb right back up and keep moving along on your journey!  We have resources available to help you!  Some websites that may be helpful include: www.http://www.wall-moore.info/  Www.VeryWellFit.com _____________________________________________________________  Activity Recommendations for All Patients  I recommend that all adults get at least 20  minutes of moderate physical activity that elevates your heart rate at least 5 days out of the week.  Some examples include: Walking or jogging at a pace that allows you to carry on a conversation Cycling (stationary bike or outdoors) Water aerobics Yoga Weight lifting Dancing If physical limitations prevent you from putting stress on your joints, exercise in a pool or seated in a chair are excellent options.  Do determine your MAXIMUM heart rate for activity: 220 - YOUR AGE = MAX Heart Rate   Remember! Do not push yourself too hard.  Start slowly and build up your pace, speed, weight, time in exercise, etc.  Allow your body to rest between exercise and get good sleep. You will need more water than normal when you are exerting yourself. Do not wait until you are thirsty to drink. Drink with a purpose of getting in at least 8, 8 ounce glasses of water a day plus more depending on how much you exercise and sweat.    If you begin to develop dizziness, chest pain,  abdominal pain, jaw pain, shortness of breath, headache, vision changes, lightheadedness, or other concerning symptoms, stop the activity and allow your body to rest. If your symptoms are severe, seek emergency evaluation immediately. If your symptoms are concerning, but not severe, please let us know so that we can recommend further evaluation.

## 2021-11-24 ENCOUNTER — Inpatient Hospital Stay: Payer: BC Managed Care – PPO

## 2021-11-24 VITALS — BP 103/66 | HR 66 | Temp 97.9°F | Resp 17

## 2021-11-24 DIAGNOSIS — D500 Iron deficiency anemia secondary to blood loss (chronic): Secondary | ICD-10-CM

## 2021-11-24 MED ORDER — SODIUM CHLORIDE 0.9 % IV SOLN
300.0000 mg | Freq: Once | INTRAVENOUS | Status: AC
  Administered 2021-11-24: 300 mg via INTRAVENOUS
  Filled 2021-11-24: qty 300

## 2021-11-24 MED ORDER — SODIUM CHLORIDE 0.9 % IV SOLN: Freq: Once | INTRAVENOUS | Status: AC

## 2021-11-24 NOTE — Patient Instructions (Signed)

## 2021-12-27 ENCOUNTER — Inpatient Hospital Stay: Payer: BC Managed Care – PPO | Admitting: Family

## 2021-12-27 ENCOUNTER — Inpatient Hospital Stay: Payer: BC Managed Care – PPO | Attending: Hematology & Oncology

## 2022-02-02 ENCOUNTER — Encounter: Payer: Self-pay | Admitting: General Practice

## 2022-02-02 ENCOUNTER — Encounter: Payer: BC Managed Care – PPO | Admitting: Family Medicine

## 2022-05-24 ENCOUNTER — Encounter: Payer: Self-pay | Admitting: Family

## 2022-05-24 ENCOUNTER — Encounter: Payer: Self-pay | Admitting: Family Medicine

## 2022-07-04 ENCOUNTER — Other Ambulatory Visit: Payer: Self-pay

## 2022-07-04 ENCOUNTER — Encounter (HOSPITAL_BASED_OUTPATIENT_CLINIC_OR_DEPARTMENT_OTHER): Payer: Self-pay

## 2022-07-04 ENCOUNTER — Emergency Department (HOSPITAL_BASED_OUTPATIENT_CLINIC_OR_DEPARTMENT_OTHER)
Admission: EM | Admit: 2022-07-04 | Discharge: 2022-07-04 | Disposition: A | Discharge status: Home / Self Care | Payer: 59 | Attending: Emergency Medicine | Admitting: Emergency Medicine

## 2022-07-04 ENCOUNTER — Other Ambulatory Visit (HOSPITAL_BASED_OUTPATIENT_CLINIC_OR_DEPARTMENT_OTHER): Payer: Self-pay

## 2022-07-04 ENCOUNTER — Encounter: Payer: Self-pay | Admitting: Family

## 2022-07-04 DIAGNOSIS — Y93G3 Activity, cooking and baking: Secondary | ICD-10-CM | POA: Diagnosis not present

## 2022-07-04 DIAGNOSIS — X102XXA Contact with fats and cooking oils, initial encounter: Secondary | ICD-10-CM | POA: Insufficient documentation

## 2022-07-04 DIAGNOSIS — T23271A Burn of second degree of right wrist, initial encounter: Secondary | ICD-10-CM | POA: Diagnosis not present

## 2022-07-04 DIAGNOSIS — T300 Burn of unspecified body region, unspecified degree: Secondary | ICD-10-CM

## 2022-07-04 MED ORDER — BACITRACIN 500 UNIT/GM EX OINT
1.0000 | TOPICAL_OINTMENT | Freq: Two times a day (BID) | CUTANEOUS | 0 refills | Status: DC
  Filled 2022-07-04: qty 28, 7d supply, fill #0

## 2022-07-04 MED ORDER — OXYCODONE HCL 5 MG PO TABS
5.0000 mg | ORAL_TABLET | Freq: Four times a day (QID) | ORAL | 0 refills | Status: DC | PRN
  Filled 2022-07-04: qty 10, 3d supply, fill #0

## 2022-07-04 MED ORDER — HYDROCODONE-ACETAMINOPHEN 5-325 MG PO TABS
1.0000 | ORAL_TABLET | Freq: Once | ORAL | Status: AC
  Administered 2022-07-04: 1 via ORAL
  Filled 2022-07-04: qty 1

## 2022-07-04 MED ORDER — BACITRACIN ZINC 500 UNIT/GM EX OINT
TOPICAL_OINTMENT | Freq: Two times a day (BID) | CUTANEOUS | Status: DC
  Filled 2022-07-04: qty 28.35

## 2022-07-04 NOTE — ED Notes (Signed)
Patient wound cleaned and bacitracin ointment applied. Non stick guaze covering wound wrapped with gauze. Patient provided instructions and supplies for wound care at home.
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
## 2022-07-04 NOTE — Discharge Instructions (Signed)
Recommend 1000 mg of Tylenol every 6 hours as needed for pain.  Use bacitracin ointment twice daily to your burn.  Roxicodone is a narcotic pain medicine to use for breakthrough pain.  Gently wash your burn with soap and water twice a day and cover.  Follow-up with your primary care doctor return for wound evaluation if needed.

## 2022-07-04 NOTE — ED Notes (Signed)
Discharge instructions reviewed with patient. Patient verbalizes understanding, no further questions at this time. Medications/prescriptions and follow up information provided. No acute distress noted at time of departure.  

## 2022-07-09 ENCOUNTER — Encounter: Payer: Self-pay | Admitting: Family Medicine

## 2022-07-09 ENCOUNTER — Ambulatory Visit: Payer: 59 | Admitting: Family Medicine

## 2022-07-09 VITALS — BP 112/64 | HR 88 | Resp 18 | Ht 61.0 in | Wt 134.6 lb

## 2022-07-09 DIAGNOSIS — T300 Burn of unspecified body region, unspecified degree: Secondary | ICD-10-CM

## 2022-07-09 DIAGNOSIS — T23171A Burn of first degree of right wrist, initial encounter: Secondary | ICD-10-CM

## 2022-07-09 NOTE — Patient Instructions (Signed)
Continue with wound care Work note provided Follow-up on Monday for wound check
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
## 2022-07-16 ENCOUNTER — Encounter: Payer: Self-pay | Admitting: Family Medicine

## 2022-07-16 ENCOUNTER — Ambulatory Visit: Payer: 59 | Admitting: Family Medicine

## 2022-07-16 VITALS — BP 121/82 | HR 92 | Temp 98.6°F | Resp 16 | Wt 136.0 lb

## 2022-07-16 DIAGNOSIS — T300 Burn of unspecified body region, unspecified degree: Secondary | ICD-10-CM

## 2022-07-16 DIAGNOSIS — T23101D Burn of first degree of right hand, unspecified site, subsequent encounter: Secondary | ICD-10-CM
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
## 2022-07-20 ENCOUNTER — Ambulatory Visit: Payer: 59 | Admitting: Family Medicine

## 2022-07-20 ENCOUNTER — Encounter: Payer: Self-pay | Admitting: Family Medicine

## 2022-07-20 VITALS — BP 117/74 | HR 82 | Temp 98.2°F | Resp 18 | Ht 62.0 in | Wt 137.4 lb

## 2022-07-20 DIAGNOSIS — T300 Burn of unspecified body region, unspecified degree: Secondary | ICD-10-CM

## 2022-07-20 DIAGNOSIS — T23071D Burn of unspecified degree of right wrist, subsequent encounter: Secondary | ICD-10-CM | POA: Diagnosis not present
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
## 2022-07-26 ENCOUNTER — Encounter: Payer: Self-pay | Admitting: Family Medicine

## 2022-07-26 ENCOUNTER — Ambulatory Visit (INDEPENDENT_AMBULATORY_CARE_PROVIDER_SITE_OTHER): Payer: 59 | Admitting: Family Medicine

## 2022-07-26 VITALS — BP 126/73 | HR 81 | Temp 98.1°F | Resp 18 | Ht 62.0 in | Wt 138.8 lb

## 2022-07-26 DIAGNOSIS — T300 Burn of unspecified body region, unspecified degree: Secondary | ICD-10-CM

## 2022-07-26 DIAGNOSIS — T23071A Burn of unspecified degree of right wrist, initial encounter: Secondary | ICD-10-CM
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
## 2022-08-02 ENCOUNTER — Ambulatory Visit: Payer: 59 | Admitting: Family Medicine

## 2022-08-03 ENCOUNTER — Ambulatory Visit: Payer: 59 | Admitting: Family Medicine

## 2022-08-03 ENCOUNTER — Encounter: Payer: Self-pay | Admitting: Family Medicine

## 2022-08-03 VITALS — BP 116/78 | HR 79 | Ht 62.0 in | Wt 137.0 lb

## 2022-08-03 DIAGNOSIS — T300 Burn of unspecified body region, unspecified degree: Secondary | ICD-10-CM

## 2022-08-03 NOTE — Patient Instructions (Signed)
Note provided for 2 weeks out of work. Follow-up at that time for wound check to see if leave needs to be extended. If additional paperwork is needed, please have a copy sent to [place].  Continue with wound care - it is looking much better!
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
## 2022-08-17 ENCOUNTER — Ambulatory Visit: Payer: 59 | Admitting: Family Medicine

## 2022-08-17 ENCOUNTER — Encounter: Payer: Self-pay | Admitting: Family Medicine

## 2022-08-17 VITALS — BP 136/80 | HR 90 | Ht 62.0 in | Wt 139.0 lb

## 2022-08-17 DIAGNOSIS — Z5189 Encounter for other specified aftercare: Secondary | ICD-10-CM | POA: Diagnosis not present
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
## 2022-08-17 NOTE — Patient Instructions (Signed)
Wounds looks healed! If you develop any new symptoms please let us know. You are good to return to work - note provided.

## 2023-04-09 ENCOUNTER — Telehealth: Payer: Self-pay | Admitting: Family Medicine

## 2023-04-09 NOTE — Telephone Encounter (Signed)
Pt's relative called and stated pt would like an appt for abdominal pain. She asked the pt and pt said it was a 9/10. Transferred to triage for nurse eval.

## 2023-04-09 NOTE — Telephone Encounter (Addendum)
Initial Comment Caller would like to schedule an appt. for her mom. She states she is abdominal pain every time she tries to eat something. Translation No Disp. Time Lamount Cohen Time) Disposition Final User 04/09/2023 3:11:00 PM Attempt made - message left Layne Benton, RN, [facility] 04/09/2023 3:47:06 PM Attempt made - message left Verdin, RN, [facility] 04/09/2023 4:03:08 PM FINAL ATTEMPT MADE - message left Yes Verdin, RN, [facility] Final Disposition 04/09/2023 4:03:08 PM FINAL ATTEMPT MADE - message left Yes Verdin, RN, [facility]     Caller Name [name] Relationship To Patient Daughter Return Phone Number [phone] (Primary

## 2023-07-23 ENCOUNTER — Encounter: Payer: Self-pay | Admitting: Family

## 2023-07-23 ENCOUNTER — Emergency Department (HOSPITAL_BASED_OUTPATIENT_CLINIC_OR_DEPARTMENT_OTHER)

## 2023-07-23 ENCOUNTER — Other Ambulatory Visit (HOSPITAL_BASED_OUTPATIENT_CLINIC_OR_DEPARTMENT_OTHER): Payer: Self-pay

## 2023-07-23 ENCOUNTER — Encounter (HOSPITAL_BASED_OUTPATIENT_CLINIC_OR_DEPARTMENT_OTHER): Payer: Self-pay | Admitting: Emergency Medicine

## 2023-07-23 ENCOUNTER — Emergency Department (HOSPITAL_BASED_OUTPATIENT_CLINIC_OR_DEPARTMENT_OTHER)
Admission: EM | Admit: 2023-07-23 | Discharge: 2023-07-23 | Disposition: A | Discharge status: Home / Self Care | Attending: Emergency Medicine | Admitting: Emergency Medicine

## 2023-07-23 ENCOUNTER — Other Ambulatory Visit: Payer: Self-pay

## 2023-07-23 DIAGNOSIS — N83209 Unspecified ovarian cyst, unspecified side: Secondary | ICD-10-CM

## 2023-07-23 DIAGNOSIS — K5900 Constipation, unspecified: Secondary | ICD-10-CM

## 2023-07-23 DIAGNOSIS — R1013 Epigastric pain: Secondary | ICD-10-CM | POA: Diagnosis present

## 2023-07-23 DIAGNOSIS — R1011 Right upper quadrant pain: Secondary | ICD-10-CM | POA: Insufficient documentation

## 2023-07-23 DIAGNOSIS — K8020 Calculus of gallbladder without cholecystitis without obstruction: Secondary | ICD-10-CM

## 2023-07-23 LAB — COMPREHENSIVE METABOLIC PANEL
ALT: 15 U/L (ref 0–44)
AST: 18 U/L (ref 15–41)
Albumin: 3.3 g/dL — ABNORMAL LOW (ref 3.5–5.0)
Alkaline Phosphatase: 72 U/L (ref 38–126)
Anion gap: 11 (ref 5–15)
BUN: 14 mg/dL (ref 6–20)
CO2: 24 mmol/L (ref 22–32)
Calcium: 9.1 mg/dL (ref 8.9–10.3)
Chloride: 101 mmol/L (ref 98–111)
Creatinine, Ser: 0.6 mg/dL (ref 0.44–1.00)
GFR, Estimated: >60 mL/min (ref >60)
Glucose, Bld: 175 mg/dL — ABNORMAL HIGH (ref 70–99)
Potassium: 3.8 mmol/L (ref 3.5–5.1)
Sodium: 136 mmol/L (ref 135–145)
Total Bilirubin: 0.5 mg/dL (ref 0.0–1.2)
Total Protein: 7.8 g/dL (ref 6.5–8.1)

## 2023-07-23 LAB — CBC WITH DIFFERENTIAL/PLATELET
Abs Immature Granulocytes: 0.03 10*3/uL (ref 0.00–0.07)
Basophils Absolute: 0 10*3/uL (ref 0.0–0.1)
Basophils Relative: 0 %
Eosinophils Absolute: 0 10*3/uL (ref 0.0–0.5)
Eosinophils Relative: 1 %
HCT: 35.5 % — ABNORMAL LOW (ref 36.0–46.0)
Hemoglobin: 11.5 g/dL — ABNORMAL LOW (ref 12.0–15.0)
Immature Granulocytes: 0 %
Lymphocytes Relative: 21 %
Lymphs Abs: 1.8 10*3/uL (ref 0.7–4.0)
MCH: 25.1 pg — ABNORMAL LOW (ref 26.0–34.0)
MCHC: 32.4 g/dL (ref 30.0–36.0)
MCV: 77.3 fL — ABNORMAL LOW (ref 80.0–100.0)
Monocytes Absolute: 0.4 10*3/uL (ref 0.1–1.0)
Monocytes Relative: 5 %
Neutro Abs: 6.3 10*3/uL (ref 1.7–7.7)
Neutrophils Relative %: 73 %
Platelets: 298 10*3/uL (ref 150–400)
RBC: 4.59 MIL/uL (ref 3.87–5.11)
RDW: 13.7 % (ref 11.5–15.5)
WBC: 8.6 10*3/uL (ref 4.0–10.5)
nRBC: 0 % (ref 0.0–0.2)

## 2023-07-23 LAB — URINALYSIS, ROUTINE W REFLEX MICROSCOPIC
Bilirubin Urine: NEGATIVE
Glucose, UA: NEGATIVE mg/dL
Ketones, ur: 15 mg/dL — AB
Leukocytes,Ua: NEGATIVE
Nitrite: NEGATIVE
Protein, ur: NEGATIVE mg/dL
Specific Gravity, Urine: >=1.03 (ref 1.005–1.030)
pH: 6.5 (ref 5.0–8.0)

## 2023-07-23 LAB — RAPID URINE DRUG SCREEN, HOSP PERFORMED
Amphetamines: NOT DETECTED
Barbiturates: NOT DETECTED
Benzodiazepines: NOT DETECTED
Cocaine: NOT DETECTED
Opiates: POSITIVE — AB
Tetrahydrocannabinol: NOT DETECTED

## 2023-07-23 LAB — URINALYSIS, MICROSCOPIC (REFLEX)

## 2023-07-23 LAB — LIPASE, BLOOD: Lipase: 26 U/L (ref 11–51)

## 2023-07-23 LAB — RESP PANEL BY RT-PCR (RSV, FLU A&B, COVID)  RVPGX2
Influenza A by PCR: NEGATIVE
Influenza B by PCR: NEGATIVE
Resp Syncytial Virus by PCR: NEGATIVE
SARS Coronavirus 2 by RT PCR: NEGATIVE

## 2023-07-23 LAB — PREGNANCY, URINE: Preg Test, Ur: NEGATIVE

## 2023-07-23 MED ORDER — FENTANYL CITRATE PF 50 MCG/ML IJ SOSY
50.0000 ug | PREFILLED_SYRINGE | Freq: Once | INTRAMUSCULAR | Status: AC
  Administered 2023-07-23: 50 ug via INTRAVENOUS
  Filled 2023-07-23: qty 1

## 2023-07-23 MED ORDER — ALUM & MAG HYDROXIDE-SIMETH 200-200-20 MG/5ML PO SUSP
30.0000 mL | Freq: Once | ORAL | Status: AC
  Administered 2023-07-23: 30 mL via ORAL
  Filled 2023-07-23: qty 30

## 2023-07-23 MED ORDER — IOHEXOL 300 MG/ML  SOLN
100.0000 mL | Freq: Once | INTRAMUSCULAR | Status: AC | PRN
  Administered 2023-07-23: 100 mL via INTRAVENOUS

## 2023-07-23 MED ORDER — ONDANSETRON HCL 4 MG PO TABS
4.0000 mg | ORAL_TABLET | ORAL | 0 refills | Status: DC | PRN
Start: 2023-07-23 — End: 2023-08-21
  Filled 2023-07-23: qty 12, 2d supply, fill #0

## 2023-07-23 MED ORDER — FENTANYL CITRATE PF 50 MCG/ML IJ SOSY: 50.0000 ug | PREFILLED_SYRINGE | INTRAMUSCULAR | Status: DC | PRN

## 2023-07-23 MED ORDER — LIDOCAINE VISCOUS HCL 2 % MT SOLN
15.0000 mL | Freq: Once | OROMUCOSAL | Status: AC
  Administered 2023-07-23: 15 mL via ORAL
  Filled 2023-07-23: qty 15

## 2023-07-23 MED ORDER — ONDANSETRON HCL 4 MG/2ML IJ SOLN
4.0000 mg | Freq: Once | INTRAMUSCULAR | Status: AC
Start: 2023-07-23 — End: 2023-07-23
  Administered 2023-07-23: 4 mg via INTRAVENOUS
  Filled 2023-07-23: qty 2

## 2023-07-23 MED ORDER — PANTOPRAZOLE SODIUM 40 MG IV SOLR
40.0000 mg | Freq: Once | INTRAVENOUS | Status: AC
  Administered 2023-07-23: 40 mg via INTRAVENOUS
  Filled 2023-07-23: qty 10

## 2023-07-23 MED ORDER — MORPHINE SULFATE (PF) 4 MG/ML IV SOLN
4.0000 mg | Freq: Once | INTRAVENOUS | Status: AC
  Administered 2023-07-23: 4 mg via INTRAVENOUS
  Filled 2023-07-23: qty 1

## 2023-07-23 MED ORDER — SODIUM CHLORIDE 0.9 % IV BOLUS
1000.0000 mL | Freq: Once | INTRAVENOUS | Status: AC
  Administered 2023-07-23: 1000 mL via INTRAVENOUS

## 2023-07-23 MED ORDER — SUCRALFATE 1 G PO TABS
1.0000 g | ORAL_TABLET | Freq: Three times a day (TID) | ORAL | 0 refills | Status: DC
  Filled 2023-07-23: qty 21, 7d supply, fill #0

## 2023-07-23 MED ORDER — POLYETHYLENE GLYCOL 3350 17 GM/SCOOP PO POWD
510.0000 g | Freq: Once | ORAL | 0 refills | Status: AC
  Filled 2023-07-23: qty 510, 10d supply, fill #0

## 2023-07-23 MED ORDER — PANTOPRAZOLE SODIUM 40 MG PO TBEC
40.0000 mg | DELAYED_RELEASE_TABLET | Freq: Every day | ORAL | 0 refills | Status: DC
  Filled 2023-07-23: qty 14, 14d supply, fill #0

## 2023-07-23 NOTE — Discharge Instructions (Addendum)
08/01/22
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
## 2023-07-23 NOTE — ED Provider Notes (Signed)
 [facility] Provider Note  CSN: [id number] Arrival date & time: 07/23/23 0421  Chief Complaint(s) Abdominal Pain  HPI Nancy Wright is a 48 y.o. female with past medical history as below, significant for IDA, menorrhagia, PUD, ovarian cyst, Minerva Areola speaking who presents to the ED with complaint of upper abdominal pain, nausea and vomiting.  Epigastric pain began last night, sharp stabbing pain, burning sensation.  Worsened after eating spicy food.  Similar symptoms in the past, denies evaluation by gastroenterology.  Emesis nonbloody nonbilious, no BRBPR or melena.  No fevers or jaundice.  No daily alcohol or NSAID use.  Took Tylenol at home which did not limit her symptoms.    Past Medical History Past Medical History:  Diagnosis Date   Medical history non-contributory    Patient Active Problem List   Diagnosis Date Noted   Iron deficiency anemia due to chronic blood loss 11/01/2021   Helicobacter pylori gastrointestinal tract infection 10/30/2021   Iron deficiency anemia 10/24/2021   Abdominal pain 10/20/2021   Dizziness 10/20/2021   Hypoalbuminemia 10/20/2021   Menorrhagia 10/20/2021   Implanon removal 10/06/2018   Implanon insertion 10/06/2018   Ovarian cyst 11/19/2017   Language barrier; speaks Arabic 12/31/2014   Home Medication(s) Prior to Admission medications   Medication Sig Start Date End Date Taking? Authorizing Provider  ondansetron (ZOFRAN) 4 MG tablet Take 1 tablet (4 mg total) by mouth every 4 (four) hours as needed for nausea or vomiting. 07/23/23  Yes Tanda Rockers A, DO  pantoprazole (PROTONIX) 40 MG tablet Take 1 tablet (40 mg total) by mouth daily for 14 days. 07/23/23 08/06/23 Yes Tanda Rockers A, DO  sucralfate (CARAFATE) 1 g tablet Take 1 tablet (1 g total) by mouth with breakfast, with lunch, and with evening meal for 7 days. 07/23/23 07/30/23 Yes Tanda Rockers A, DO  bacitracin 500 UNIT/GM ointment Apply 1 Application  topically 2 (two) times daily. 07/04/22   Curatolo, Adam, DO  oxyCODONE (ROXICODONE) 5 MG immediate release tablet Take 1 tablet (5 mg total) by mouth every 6 (six) hours as needed for up to 10 doses for breakthrough pain. 07/04/22   Virgina Norfolk, DO                                                                                                                                    Past Surgical History Past Surgical History:  Procedure Laterality Date   CESAREAN SECTION     Family History Family History  Problem Relation Age of Onset   Diabetes Father    Hypertension Father     Social History Social History   Tobacco Use   Smoking status: Never   Smokeless tobacco: Never  Vaping Use   Vaping status: Never Used  Substance Use Topics   Alcohol use: No   Drug use: No   Allergies Patient has no known allergies.  Review of  Systems A thorough review of systems was obtained and all systems are negative except as noted in the HPI and PMH.   Physical Exam Vital Signs  I have reviewed the triage vital signs BP (!) 144/74 (BP Location: Right Arm)   Pulse 82   Temp 97.6 F (36.4 C) (Oral)   Resp 16   Ht 5\' 2"  (1.575 m)   Wt 63.5 kg   SpO2 100%   BMI 25.61 kg/m  Physical Exam Vitals and nursing note reviewed.  Constitutional:      Appearance: Normal appearance. She is well-developed. She is not toxic-appearing or diaphoretic.  HENT:     Head: Normocephalic and atraumatic.     Right Ear: External ear normal.     Left Ear: External ear normal.     Nose: Nose normal.     Mouth/Throat:     Mouth: Mucous membranes are moist.  Eyes:     General: No scleral icterus.       Right eye: No discharge.        Left eye: No discharge.  Cardiovascular:     Rate and Rhythm: Normal rate and regular rhythm.     Pulses: Normal pulses.     Heart sounds: Normal heart sounds.  Pulmonary:     Effort: Pulmonary effort is normal. No respiratory distress.     Breath sounds: Normal breath  sounds. No stridor.  Abdominal:     General: Abdomen is flat. There is no distension.     Palpations: Abdomen is soft.     Tenderness: There is abdominal tenderness in the right upper quadrant and epigastric area. There is no guarding. Negative signs include Murphy's sign.  Musculoskeletal:     Cervical back: No rigidity.     Right lower leg: No edema.     Left lower leg: No edema.  Skin:    General: Skin is warm and dry.     Capillary Refill: Capillary refill takes less than 2 seconds.  Neurological:     Mental Status: She is alert.  Psychiatric:        Mood and Affect: Mood normal.        Behavior: Behavior normal. Behavior is cooperative.     ED Results and Treatments Labs (all labs ordered are listed, but only abnormal results are displayed) Labs Reviewed  CBC WITH DIFFERENTIAL/PLATELET - Abnormal; Notable for the following components:      Result Value   Hemoglobin 11.5 (*)    HCT 35.5 (*)    MCV 77.3 (*)    MCH 25.1 (*)    All other components within normal limits  COMPREHENSIVE METABOLIC PANEL - Abnormal; Notable for the following components:   Glucose, Bld 175 (*)    Albumin 3.3 (*)    All other components within normal limits  RESP PANEL BY RT-PCR (RSV, FLU A&B, COVID)  RVPGX2  LIPASE, BLOOD  URINALYSIS, ROUTINE W REFLEX MICROSCOPIC  PREGNANCY, URINE  RAPID URINE DRUG SCREEN, HOSP PERFORMED  Radiology No results found.  Pertinent labs & imaging results that were available during my care of the patient were reviewed by me and considered in my medical decision making (see MDM for details).  Medications Ordered in ED Medications  morphine (PF) 4 MG/ML injection 4 mg (4 mg Intravenous Given 07/23/23 0506)  ondansetron (ZOFRAN) injection 4 mg (4 mg Intravenous Given 07/23/23 0503)  pantoprazole (PROTONIX) injection 40 mg (40 mg Intravenous Given  07/23/23 0512)  alum & mag hydroxide-simeth (MAALOX/MYLANTA) 200-200-20 MG/5ML suspension 30 mL (30 mLs Oral Given 07/23/23 0503)    And  lidocaine (XYLOCAINE) 2 % viscous mouth solution 15 mL (15 mLs Oral Given 07/23/23 0503)  sodium chloride 0.9 % bolus 1,000 mL (0 mLs Intravenous Stopped 07/23/23 6295)                                                                                                                                     Procedures Procedures  (including critical care time)  Medical Decision Making / ED Course    Medical Decision Making:    Ryonna Hazell is a 48 y.o. female with past medical history as below, significant for IDA, PUD, menorrhagia, ovarian cyst, Minerva Areola speaking who presents to the ED with complaint of upper abdominal pain, nausea and vomiting.. The complaint involves an extensive differential diagnosis and also carries with it a high risk of complications and morbidity.  Serious etiology was considered. Ddx includes but is not limited to: Differential diagnosis includes but is not exclusive to acute cholecystitis, intrathoracic causes for epigastric abdominal pain, gastritis, duodenitis, pancreatitis, small bowel or large bowel obstruction, abdominal aortic aneurysm, hernia, gastritis, etc.   Complete initial physical exam performed, notably the patient was in mild distress secondary to pain, abdomen is nonperitoneal.    Reviewed and confirmed nursing documentation for past medical history, family history, social history.  Vital signs reviewed.      Clinical Course as of 07/23/23 0655  Tue Jul 23, 2023  0455 Arabic interpreter [id number] [SG]  0456 Hemoglobin(!): 11.5 Similar to prior [SG]    Clinical Course User Index [SG] Sloan Leiter, DO    Brief summary: 48 year old female history as above here with epigastric pain, nausea vomiting.  No report of bleeding.  Similar symptoms in the past after eating spicy food.  Abdomen TTP epigastrium, negative Murphy  sign.  Will get screening labs, give analgesics, get CT imaging  Labs reviewed, these are stable.  She is feeling better after intervention.  Pain seems to be improving.  CT imaging pending at time of shift change.  Handoff to Dr. Karene Fry pending imaging and recheck              Additional history obtained: -Additional history obtained from family -External records from outside source obtained and reviewed including: Chart review including previous notes, labs, imaging, consultation notes including  Prior ED visits Visit/21 with epigastric pain, started  on PPI and Carafate, high Cosamin, was discharged in stable condition   Lab Tests: -I ordered, reviewed, and interpreted labs.   The pertinent results include:   Labs Reviewed  CBC WITH DIFFERENTIAL/PLATELET - Abnormal; Notable for the following components:      Result Value   Hemoglobin 11.5 (*)    HCT 35.5 (*)    MCV 77.3 (*)    MCH 25.1 (*)    All other components within normal limits  COMPREHENSIVE METABOLIC PANEL - Abnormal; Notable for the following components:   Glucose, Bld 175 (*)    Albumin 3.3 (*)    All other components within normal limits  RESP PANEL BY RT-PCR (RSV, FLU A&B, COVID)  RVPGX2  LIPASE, BLOOD  URINALYSIS, ROUTINE W REFLEX MICROSCOPIC  PREGNANCY, URINE  RAPID URINE DRUG SCREEN, HOSP PERFORMED    Notable for labs stable  EKG   EKG Interpretation Date/Time:    Ventricular Rate:    PR Interval:    QRS Duration:    QT Interval:    QTC Calculation:   R Axis:      Text Interpretation:           Imaging Studies ordered: I ordered imaging studies including CTAP I independently visualized the following imaging with scope of interpretation limited to determining acute life threatening conditions related to emergency care; findings noted above I independently visualized and interpreted imaging. I agree with the radiologist interpretation   Medicines ordered and prescription drug  management: Meds ordered this encounter  Medications   morphine (PF) 4 MG/ML injection 4 mg   ondansetron (ZOFRAN) injection 4 mg   pantoprazole (PROTONIX) injection 40 mg   AND Linked Order Group    alum & mag hydroxide-simeth (MAALOX/MYLANTA) 200-200-20 MG/5ML suspension 30 mL    lidocaine (XYLOCAINE) 2 % viscous mouth solution 15 mL   sodium chloride 0.9 % bolus 1,000 mL   pantoprazole (PROTONIX) 40 MG tablet    Sig: Take 1 tablet (40 mg total) by mouth daily for 14 days.    Dispense:  14 tablet    Refill:  0   sucralfate (CARAFATE) 1 g tablet    Sig: Take 1 tablet (1 g total) by mouth with breakfast, with lunch, and with evening meal for 7 days.    Dispense:  21 tablet    Refill:  0   ondansetron (ZOFRAN) 4 MG tablet    Sig: Take 1 tablet (4 mg total) by mouth every 4 (four) hours as needed for nausea or vomiting.    Dispense:  12 tablet    Refill:  0    -I have reviewed the patients home medicines and have made adjustments as needed   Consultations Obtained: na   Cardiac Monitoring: Continuous pulse oximetry interpreted by myself, 100% on RA.    Social Determinants of Health:  Diagnosis or treatment significantly limited by social determinants of health: arabic speaker    Reevaluation: After the interventions noted above, I reevaluated the patient and found that they have improved  Co morbidities that complicate the patient evaluation  Past Medical History:  Diagnosis Date   Medical history non-contributory       Dispostion: Disposition decision including need for hospitalization was considered, and patient disposition pending at time of sign out.    Final Clinical Impression(s) / ED Diagnoses Final diagnoses:  Epigastric pain        Sloan Leiter, DO 07/23/23 [phone]

## 2023-07-23 NOTE — ED Triage Notes (Signed)
 Pt c/o abd pain across upper abd with radiation to right side since last night, + n/v, denies fevers, diarrhea, urinary symptoms

## 2023-07-24 ENCOUNTER — Other Ambulatory Visit (HOSPITAL_BASED_OUTPATIENT_CLINIC_OR_DEPARTMENT_OTHER): Payer: Self-pay

## 2023-08-20 ENCOUNTER — Encounter: Payer: Self-pay | Admitting: Family

## 2023-08-26 ENCOUNTER — Other Ambulatory Visit: Payer: Self-pay

## 2023-08-26 ENCOUNTER — Ambulatory Visit: Payer: Self-pay | Admitting: General Surgery

## 2023-08-26 ENCOUNTER — Encounter (HOSPITAL_COMMUNITY): Payer: Self-pay | Admitting: General Surgery

## 2023-08-26 NOTE — Progress Notes (Signed)
 PCP - Minna Amass, NP Cardiologist - none  Chest x-ray - n/a EKG - n/a Stress Test - n/a ECHO - n/a Cardiac Cath - n/a  ICD Pacemaker/Loop - n/a  Sleep Study -  n/a  Diabetes - n/a  Aspirin & Blood Thinner Instructions:  n/a  ERAS - clear liquids til 4:30 AM DOS.  Anesthesia review: no  STOP now taking any Aspirin (unless otherwise instructed by your surgeon), Aleve , Naproxen , Ibuprofen , Motrin , Advil , Goody's, BC's, all herbal medications, fish oil, and all vitamins.   Coronavirus Screening Do you have any of the following symptoms:  Cough yes/no: No Fever (>100.4F)  yes/no: No Runny nose yes/no: No Sore throat yes/no: No Difficulty breathing/shortness of breath  yes/no: No  Have you traveled in the last 14 days and where? yes/no: No  Patient verbalized understanding of instructions that were given via phone via Arabic Interpreter ID #  [id number].

## 2023-08-27 ENCOUNTER — Other Ambulatory Visit: Payer: Self-pay

## 2023-08-27 ENCOUNTER — Ambulatory Visit (HOSPITAL_COMMUNITY): Admitting: Anesthesiology

## 2023-08-27 ENCOUNTER — Encounter (HOSPITAL_COMMUNITY)
Admission: RE | Disposition: A | Discharge status: Home / Self Care | Payer: Self-pay | Source: Home / Self Care | Attending: General Surgery

## 2023-08-27 ENCOUNTER — Ambulatory Visit (HOSPITAL_COMMUNITY)
Admission: RE | Admit: 2023-08-27 | Discharge: 2023-08-27 | Disposition: A | Discharge status: Home / Self Care | Attending: General Surgery | Admitting: General Surgery

## 2023-08-27 ENCOUNTER — Encounter: Payer: Self-pay | Admitting: Family

## 2023-08-27 ENCOUNTER — Ambulatory Visit (HOSPITAL_BASED_OUTPATIENT_CLINIC_OR_DEPARTMENT_OTHER): Admitting: Anesthesiology

## 2023-08-27 ENCOUNTER — Encounter (HOSPITAL_COMMUNITY): Payer: Self-pay | Admitting: General Surgery

## 2023-08-27 ENCOUNTER — Other Ambulatory Visit (HOSPITAL_BASED_OUTPATIENT_CLINIC_OR_DEPARTMENT_OTHER): Payer: Self-pay

## 2023-08-27 DIAGNOSIS — K8010 Calculus of gallbladder with chronic cholecystitis without obstruction: Secondary | ICD-10-CM | POA: Insufficient documentation

## 2023-08-27 HISTORY — PX: CHOLECYSTECTOMY: SHX55

## 2023-08-27 LAB — CBC
HCT: 38.2 % (ref 36.0–46.0)
Hemoglobin: 12 g/dL (ref 12.0–15.0)
MCH: 24.2 pg — ABNORMAL LOW (ref 26.0–34.0)
MCHC: 31.4 g/dL (ref 30.0–36.0)
MCV: 77 fL — ABNORMAL LOW (ref 80.0–100.0)
Platelets: 315 10*3/uL (ref 150–400)
RBC: 4.96 MIL/uL (ref 3.87–5.11)
RDW: 13.6 % (ref 11.5–15.5)
WBC: 6.5 10*3/uL (ref 4.0–10.5)
nRBC: 0 % (ref 0.0–0.2)

## 2023-08-27 LAB — POCT PREGNANCY, URINE: Preg Test, Ur: NEGATIVE

## 2023-08-27 SURGERY — LAPAROSCOPIC CHOLECYSTECTOMY
Anesthesia: General

## 2023-08-27 MED ORDER — INDOCYANINE GREEN 25 MG IV SOLR
2.5000 mg | Freq: Once | INTRAVENOUS | Status: AC
Start: 2023-08-27 — End: 2023-08-27
  Administered 2023-08-27: 2.5 mg via INTRAVENOUS
  Filled 2023-08-27: qty 10

## 2023-08-27 MED ORDER — CHLORHEXIDINE GLUCONATE CLOTH 2 % EX PADS: 6.0000 | MEDICATED_PAD | Freq: Once | CUTANEOUS | Status: DC

## 2023-08-27 MED ORDER — ROCURONIUM BROMIDE 10 MG/ML (PF) SYRINGE
PREFILLED_SYRINGE | INTRAVENOUS | Status: DC | PRN
  Administered 2023-08-27: 10 mg via INTRAVENOUS
  Administered 2023-08-27: 50 mg via INTRAVENOUS

## 2023-08-27 MED ORDER — ACETAMINOPHEN 650 MG RE SUPP: 650.0000 mg | RECTAL | Status: DC | PRN

## 2023-08-27 MED ORDER — SUGAMMADEX SODIUM 200 MG/2ML IV SOLN
INTRAVENOUS | Status: DC | PRN
Start: 2023-08-27 — End: 2023-08-27
  Administered 2023-08-27: 200 mg via INTRAVENOUS

## 2023-08-27 MED ORDER — GABAPENTIN 300 MG PO CAPS
300.0000 mg | ORAL_CAPSULE | ORAL | Status: AC
  Administered 2023-08-27: 300 mg via ORAL
  Filled 2023-08-27: qty 1

## 2023-08-27 MED ORDER — OXYCODONE HCL 5 MG PO TABS
5.0000 mg | ORAL_TABLET | Freq: Once | ORAL | Status: AC | PRN
  Administered 2023-08-27: 5 mg via ORAL

## 2023-08-27 MED ORDER — ONDANSETRON HCL 4 MG/2ML IJ SOLN
INTRAMUSCULAR | Status: DC | PRN
  Administered 2023-08-27: 4 mg via INTRAVENOUS

## 2023-08-27 MED ORDER — ACETAMINOPHEN 500 MG PO TABS
1000.0000 mg | ORAL_TABLET | ORAL | Status: AC
  Administered 2023-08-27: 1000 mg via ORAL
  Filled 2023-08-27: qty 2

## 2023-08-27 MED ORDER — FENTANYL CITRATE (PF) 250 MCG/5ML IJ SOLN
INTRAMUSCULAR | Status: DC | PRN
  Administered 2023-08-27 (×3): 50 ug via INTRAVENOUS

## 2023-08-27 MED ORDER — OXYCODONE HCL 5 MG/5ML PO SOLN: 5.0000 mg | Freq: Once | ORAL | Status: AC | PRN

## 2023-08-27 MED ORDER — ACETAMINOPHEN 325 MG PO TABS: 650.0000 mg | ORAL_TABLET | ORAL | Status: DC | PRN

## 2023-08-27 MED ORDER — PROPOFOL 10 MG/ML IV BOLUS
INTRAVENOUS | Status: AC
  Filled 2023-08-27: qty 20

## 2023-08-27 MED ORDER — CELECOXIB 200 MG PO CAPS
200.0000 mg | ORAL_CAPSULE | ORAL | Status: AC
  Administered 2023-08-27: 200 mg via ORAL
  Filled 2023-08-27: qty 1

## 2023-08-27 MED ORDER — CEFAZOLIN SODIUM-DEXTROSE 2-4 GM/100ML-% IV SOLN
2.0000 g | INTRAVENOUS | Status: AC
  Administered 2023-08-27: 2 g via INTRAVENOUS
  Filled 2023-08-27: qty 100

## 2023-08-27 MED ORDER — MIDAZOLAM HCL 2 MG/2ML IJ SOLN
INTRAMUSCULAR | Status: DC | PRN
  Administered 2023-08-27: 2 mg via INTRAVENOUS

## 2023-08-27 MED ORDER — ACETAMINOPHEN 325 MG PO TABS
650.0000 mg | ORAL_TABLET | Freq: Four times a day (QID) | ORAL | 0 refills | Status: AC
  Filled 2023-08-27: qty 100, 13d supply, fill #0

## 2023-08-27 MED ORDER — PROPOFOL 10 MG/ML IV BOLUS
INTRAVENOUS | Status: DC | PRN
  Administered 2023-08-27: 120 mg via INTRAVENOUS

## 2023-08-27 MED ORDER — 0.9 % SODIUM CHLORIDE (POUR BTL) OPTIME
TOPICAL | Status: DC | PRN
  Administered 2023-08-27: 1000 mL

## 2023-08-27 MED ORDER — LIDOCAINE 2% (20 MG/ML) 5 ML SYRINGE
INTRAMUSCULAR | Status: DC | PRN
  Administered 2023-08-27: 100 mg via INTRAVENOUS

## 2023-08-27 MED ORDER — FENTANYL CITRATE (PF) 100 MCG/2ML IJ SOLN: 25.0000 ug | INTRAMUSCULAR | Status: DC | PRN

## 2023-08-27 MED ORDER — ORAL CARE MOUTH RINSE: 15.0000 mL | Freq: Once | OROMUCOSAL | Status: AC

## 2023-08-27 MED ORDER — OXYCODONE HCL 5 MG PO TABS
ORAL_TABLET | ORAL | Status: AC
  Filled 2023-08-27: qty 1

## 2023-08-27 MED ORDER — ACETAMINOPHEN 10 MG/ML IV SOLN: 1000.0000 mg | Freq: Once | INTRAVENOUS | Status: DC | PRN

## 2023-08-27 MED ORDER — CHLORHEXIDINE GLUCONATE CLOTH 2 % EX PADS
6.0000 | MEDICATED_PAD | Freq: Once | CUTANEOUS | Status: DC
Start: 2023-08-27 — End: 2023-08-27

## 2023-08-27 MED ORDER — IBUPROFEN 200 MG PO TABS
600.0000 mg | ORAL_TABLET | Freq: Four times a day (QID) | ORAL | 0 refills | Status: AC
  Filled 2023-08-27: qty 100, 9d supply, fill #0

## 2023-08-27 MED ORDER — ONDANSETRON HCL 4 MG/2ML IJ SOLN: 4.0000 mg | Freq: Once | INTRAMUSCULAR | Status: DC | PRN

## 2023-08-27 MED ORDER — CHLORHEXIDINE GLUCONATE 0.12 % MT SOLN
15.0000 mL | Freq: Once | OROMUCOSAL | Status: AC
  Administered 2023-08-27: 15 mL via OROMUCOSAL
  Filled 2023-08-27: qty 15

## 2023-08-27 MED ORDER — SODIUM CHLORIDE 0.9% FLUSH: 3.0000 mL | Freq: Two times a day (BID) | INTRAVENOUS | Status: DC

## 2023-08-27 MED ORDER — PHENYLEPHRINE 80 MCG/ML (10ML) SYRINGE FOR IV PUSH (FOR BLOOD PRESSURE SUPPORT)
PREFILLED_SYRINGE | INTRAVENOUS | Status: DC | PRN
  Administered 2023-08-27 (×2): 80 ug via INTRAVENOUS

## 2023-08-27 MED ORDER — FENTANYL CITRATE (PF) 250 MCG/5ML IJ SOLN
INTRAMUSCULAR | Status: AC
Start: 2023-08-27 — End: ?
  Filled 2023-08-27: qty 5

## 2023-08-27 MED ORDER — LACTATED RINGERS IV SOLN: INTRAVENOUS | Status: DC

## 2023-08-27 MED ORDER — MORPHINE SULFATE (PF) 2 MG/ML IV SOLN: 1.0000 mg | INTRAVENOUS | Status: DC | PRN

## 2023-08-27 MED ORDER — MIDAZOLAM HCL 2 MG/2ML IJ SOLN
INTRAMUSCULAR | Status: AC
  Filled 2023-08-27: qty 2

## 2023-08-27 MED ORDER — DEXAMETHASONE SODIUM PHOSPHATE 10 MG/ML IJ SOLN
INTRAMUSCULAR | Status: DC | PRN
  Administered 2023-08-27: 10 mg via INTRAVENOUS

## 2023-08-27 MED ORDER — SODIUM CHLORIDE 0.9 % IR SOLN
Status: DC | PRN
  Administered 2023-08-27: 1000 mL

## 2023-08-27 MED ORDER — SODIUM CHLORIDE 0.9% FLUSH: 3.0000 mL | INTRAVENOUS | Status: DC | PRN

## 2023-08-27 MED ORDER — BUPIVACAINE-EPINEPHRINE (PF) 0.25% -1:200000 IJ SOLN
INTRAMUSCULAR | Status: AC
  Filled 2023-08-27: qty 30

## 2023-08-27 MED ORDER — OXYCODONE HCL 5 MG PO TABS
5.0000 mg | ORAL_TABLET | Freq: Three times a day (TID) | ORAL | 0 refills | Status: AC | PRN
  Filled 2023-08-27: qty 12, 4d supply, fill #0

## 2023-08-27 MED ORDER — SODIUM CHLORIDE 0.9 % IV SOLN: 250.0000 mL | INTRAVENOUS | Status: DC | PRN

## 2023-08-27 MED ORDER — OXYCODONE HCL 5 MG PO TABS: 5.0000 mg | ORAL_TABLET | ORAL | Status: DC | PRN

## 2023-08-27 MED ORDER — BUPIVACAINE-EPINEPHRINE (PF) 0.25% -1:200000 IJ SOLN
INTRAMUSCULAR | Status: DC | PRN
  Administered 2023-08-27: 20 mL

## 2023-08-27 MED ORDER — PHENYLEPHRINE HCL-NACL 20-0.9 MG/250ML-% IV SOLN
INTRAVENOUS | Status: DC | PRN
  Administered 2023-08-27: 20 ug/min via INTRAVENOUS

## 2023-08-27 SURGICAL SUPPLY — 36 items
BAG COUNTER SPONGE SURGICOUNT (BAG) ×1 IMPLANT
CANISTER SUCT 3000ML PPV (MISCELLANEOUS) ×1 IMPLANT
CHLORAPREP W/TINT 26 (MISCELLANEOUS) ×1 IMPLANT
CLIP APPLIE ROT 10 11.4 M/L (STAPLE) ×1 IMPLANT
COVER SURGICAL LIGHT HANDLE (MISCELLANEOUS) ×1 IMPLANT
DERMABOND ADVANCED .7 DNX12 (GAUZE/BANDAGES/DRESSINGS) ×1 IMPLANT
ELECTRODE REM PT RTRN 9FT ADLT (ELECTROSURGICAL) ×1 IMPLANT
ENDOLOOP SUT PDS II 0 18 (SUTURE) IMPLANT
GLOVE BIO SURGEON STRL SZ7 (GLOVE) ×1 IMPLANT
GOWN STRL REUS W/ TWL LRG LVL3 (GOWN DISPOSABLE) ×2 IMPLANT
GOWN STRL REUS W/ TWL XL LVL3 (GOWN DISPOSABLE) ×1 IMPLANT
GRASPER SUT TROCAR 14GX15 (MISCELLANEOUS) ×1 IMPLANT
IRRIGATION SUCT STRKRFLW 2 WTP (MISCELLANEOUS) ×1 IMPLANT
KIT BASIN OR (CUSTOM PROCEDURE TRAY) ×1 IMPLANT
KIT IMAGING PINPOINTPAQ (MISCELLANEOUS) IMPLANT
KIT TURNOVER KIT B (KITS) ×1 IMPLANT
LHOOK LAP DISP 36CM (ELECTROSURGICAL) ×1 IMPLANT
NDL 22X1.5 STRL (OR ONLY) (MISCELLANEOUS) ×1 IMPLANT
NDL INSUFFLATION 14GA 120MM (NEEDLE) ×1 IMPLANT
NEEDLE 22X1.5 STRL (OR ONLY) (MISCELLANEOUS) ×1 IMPLANT
NEEDLE INSUFFLATION 14GA 120MM (NEEDLE) ×1 IMPLANT
NS IRRIG 1000ML POUR BTL (IV SOLUTION) ×1 IMPLANT
PAD ARMBOARD POSITIONER FOAM (MISCELLANEOUS) ×1 IMPLANT
PENCIL BUTTON HOLSTER BLD 10FT (ELECTRODE) ×1 IMPLANT
POUCH RETRIEVAL ECOSAC 10 (ENDOMECHANICALS) ×1 IMPLANT
SCISSORS LAP 5X35 DISP (ENDOMECHANICALS) ×1 IMPLANT
SET TUBE SMOKE EVAC HIGH FLOW (TUBING) ×1 IMPLANT
SLEEVE Z-THREAD 5X100MM (TROCAR) ×2 IMPLANT
SUT MNCRL AB 4-0 PS2 18 (SUTURE) ×1 IMPLANT
TOWEL GREEN STERILE (TOWEL DISPOSABLE) ×1 IMPLANT
TOWEL GREEN STERILE FF (TOWEL DISPOSABLE) ×1 IMPLANT
TRAY LAPAROSCOPIC MC (CUSTOM PROCEDURE TRAY) ×1 IMPLANT
TROCAR Z THREAD OPTICAL 12X100 (TROCAR) ×1 IMPLANT
TROCAR Z-THREAD OPTICAL 5X100M (TROCAR) ×1 IMPLANT
WARMER LAPAROSCOPE (MISCELLANEOUS) ×1 IMPLANT
WATER STERILE IRR 1000ML POUR (IV SOLUTION) ×1 IMPLANT

## 2023-08-27 NOTE — Transfer of Care (Signed)
 Immediate Anesthesia Transfer of Care Note  Patient: Dayshia Y Pressnell  Procedure(s) Performed: LAPAROSCOPIC CHOLECYSTECTOMY  Patient Location: PACU  Anesthesia Type:General  Level of Consciousness: drowsy  Airway & Oxygen Therapy: Patient Spontanous Breathing and Patient connected to face mask oxygen  Post-op Assessment: Report given to RN, Post -op Vital signs reviewed and stable, and Patient moving all extremities X 4  Post vital signs: Reviewed and stable  Last Vitals:  Vitals Value Taken Time  BP 124/68 08/27/23 0904  Temp 36.6 C 08/27/23 0904  Pulse 69 08/27/23 0909  Resp 16 08/27/23 0909  SpO2 100 % 08/27/23 0909  Vitals shown include unfiled device data.  Last Pain:  Vitals:   08/27/23 0904  TempSrc:   PainSc: Asleep      Patients Stated Pain Goal: 0 (08/27/23 0960)  Complications: No notable events documented.

## 2023-08-27 NOTE — Anesthesia Postprocedure Evaluation (Signed)
 Anesthesia Post Note  Patient: Nancy Wright  Procedure(s) Performed: LAPAROSCOPIC CHOLECYSTECTOMY     Patient location during evaluation: PACU Anesthesia Type: General Level of consciousness: awake and alert Pain management: pain level controlled Vital Signs Assessment: post-procedure vital signs reviewed and stable Respiratory status: spontaneous breathing, nonlabored ventilation, respiratory function stable and patient connected to nasal cannula oxygen Cardiovascular status: blood pressure returned to baseline and stable Postop Assessment: no apparent nausea or vomiting Anesthetic complications: no   No notable events documented.  Last Vitals:  Vitals:   08/27/23 0934 08/27/23 1010  BP: 108/86 111/65  Pulse: 82 70  Resp: 16 20  Temp: 36.6 C   SpO2: 100% 100%    Last Pain:  Vitals:   08/27/23 0955  TempSrc:   PainSc: 4                  [name]

## 2023-08-27 NOTE — Op Note (Signed)
 Patient: Nancy Wright MRN: [id number] DOB: [birth year] Sex: female Operation/Procedure Date: 08/27/2023 Surgeons and Role:    * Wojciech Willetts, Willeen Harold, MD - Primary Pre-operative Diagnoses: CHOLECYSTITIS Postoperative Diagnoses: CHOLECYSTITIS Procedure performed: Procedure:    LAPAROSCOPIC CHOLECYSTECTOMY CPT(R) Code:  78295 - PR LAPAROSCOPY SURG CHOLECYSTECTOMY  Anesthesia: General endotracheal anesthesia  Indications: [name] is a 48 year old female who was evaluated in the office and determined to have gallbladder disease. I offered her cholecystectomy. Preoperatively, I discussed in detail the risks, benefits, alternatives, and potential complications. The patient understands and requests to proceed.  Operative Findings: inflammation consistent with cholecystitis.  Operative Narrative: The patient was positively identified and was taken to the operating room and placed supine on the operating table. A time-out was performed confirming correct patient and procedure. We also confirmed initiation of deep venous thrombosis prophylaxis and wound prophylaxis. After successful induction of general endotracheal anesthesia, the arms were carefully padded. An orogastric tube and footboard were placed. The abdomen was prepped and draped in the usual sterile surgical fashion.  We began our peritoneal access with a veress needle inserted at Palmer's point.  After aspiration showed return of air bubbles and there was a positive saline drop test, the insufflation was connected and the abdomen brought to a pressure of .  We then used an opti-view technique to place a 5mm port just to the right of midline, superior to the umbilicus.  A laparoscope was introduced into the abdomen, and there were no signs of injury from entry.  A 12mm port was placed in the subxiphoid position.  Two additional 5mm ports were placed in the RUQ.  A 360-degree visualization with a 30-degree 5-mm laparoscope revealed grossly  normal intra-abdominal contents.   The patient was placed in the head up position and tilted slightly to the left. The dome of the gallbladder was then grasped, elevated, and retracted anteriorly and cephalad.  The infundibulum was retracted laterally and inferiorly exposing Calot's triangle. The investing visceral peritoneal attachments overlying the infundibulum of the gallbladder were incised using the hook electrocautery and dissected free from the gallbladder itself. We soon developed two structures into the gallbladder consistent with the cystic duct and cystic artery. The loose areolar tissue around these structures was dissected free. The gallbladder was separated from the gallbladder fossa for approximately a third the distance up from the cystic plate, establishing the critical view of safety. We then transitioned to infrared viewing mode to allow for visualization of the ICG tracer. There was tracer throughout the liver. There was tracer within the candidate cystic duct as well as in a separate structure medially to the duct, consistent with where the common bile duct would be expected.  There was no tracer within the candidate cystic artery.  There was tracer within the duodenum, indicating no presence of a biliary obstruction. The cystic duct and cystic artery were triply clipped. These were divided using laparoscopic scissors leaving a single clip on the removal side. The gallbladder was elevated off the gallbladder fossa using the hook Bovie. The gallbladder was then exteriorized through the subxiphoid port site using an EndoCatch bag. We reestablished pneumoperitoneum and confirmed no leakage of blood or bile. We confirmed integrity of our clips. The subhepatic space was irrigated with warm sterile saline and suctioned free. The 12mm port site was closed using an 0-vicryl on a suture passer.We placed 0.25% Marcaine  with epinephrine  at each incision site for local anesthesia. The skin was closed  using 4-0 Monocryl  subcuticular suture. Dermabond was applied. The patient tolerated the procedure well, was extubated, and taken to the recovery room.  Estimated Blood Loss: Minimal Specimens: Gallbladder Implants: None Drains: None Complications: None Condition of the patient: Good, extubated Disposition: PACU  Cannon Champion Date: 08/27/2023 Time: 8:54 AM

## 2023-08-27 NOTE — Discharge Instructions (Signed)
 Home Care After Gallbladder Removal  Activity  Limit activity for the first 24 hours, then you may return to normal daily activities. Returning to normal daily activities as soon as you can following surgery will enhance recovery time.  No heavy lifting pushing or pulling, anything heavier than 10 pounds (gallon of milk weighs approx. 8.8 pounds) for 2 weeks from surgery date.  Do not mow the lawn, use a vacuum cleaner, or do any other strenuous activities without first consulting your surgical team.  Climb stairs slowly and watch your step.  Walk as often as you feel able to increase strength and endurance.  No driving or operating heavy machinery within 24 hours of taking narcotic pain medication.  Diet  Drink plenty of fluids and eat a light meal on the night of surgery. Some patients may find their appetite is poor for a week or two after surgery. This is a normal result of the stress of surgery-your appetite will return in time.   There are no specific diet restrictions after surgery and you resume regular diet as tolerated. However, you may want to refrain from fatty, heavy, and/or greasy foods and follow a low fat diet for 2-4 weeks to avoid temporary diarrhea and nausea. Slowly add fats back into diet.   If you have diarrhea, try avoiding spicy foods, dairy products, fatty foods, and alcohol. You can also watch to see if specific foods cause it, and stop eating them. If the diarrhea continues for more than 2 weeks, talk to your doctor.  Dressings and Wound Care  Keep your wound or incision site clean and dry.  You may have different types of dressings covering your incisions depending on your operation and your surgeon: o Dermabond/Durabond (skin glue): This will usually remain in place for 10-14 days, then naturally fall off your skin. You may take a shower 24 hrs after surgery, carefully wash, not scrub the incision site with a mild non-scented soap. Pat dry with a soft towel.  Do not pick  or peel skin glue off. o Combined outer dressing and inner steri-strips: The outer dressing is typically a plastic film that has a small bandage attached to it. This outer dressing can be removed two days after your operation. For example, if your operation was on Monday, the outer dressing can be removed Wednesday. This should be done for each site you have an incision. Once removed, you will expose the inner steri-strip which looks like a paper strip. Leave these on for about two weeks until they start to peel and ten you can take them off.  o Steri-strips alone: Leave these on for about two weeks until they start to peel and then you can take them off.   You can shower and let the water fall on the dressings above. Do not soak or submerge your incision(s) in a bath tub, hot tub, or swimming pool, until your doctor says it is ok to do so or the incision(s) have completely healed, usually about 2-4 weeks.  Do not use creams, powder, salves or balms on your incision(s).  What to Expect After Surgery  Moderate discomfort controlled with medications  You may feel pain in one or both shoulders. This pain comes from the gas still left in your belly after the surgery, if you had laparoscopic surgery (several small incisions). The pain should ease over several days to a week. Ambulation will help with this pain.   Minimal drainage from incision  Belly swelling  Feeling fatigue and weak  Loose stools after eating. This may occur for 4-8 weeks; however longer in some cases.   Constipation after surgery is common. Drink plenty fluids and eat a high fiber diet.   Pain Control: Prescribed Non-Narcotic Pain Medication  You will be given three prescriptions.  Two of them will be for prescription strength ibuprofen (i.e. Advil) and prescription strength acetaminophen (i.e. Tylenol).  The vast majority of patients will just need these two medications.  One prescription will be for a 'rescue' prescription of an oral  narcotic (oxycodone).  You may fill this if needed.  You will alternate taking the ibuprofen (600mg ) every 6 hours and also the acetaminophen (650mg ) every 6 hours so that you are taking one of those medications every 3 hours.  For example: o 0800 - take ibuprofen 600mg  o 1100 - take acetaminophen 650mg  o 1400 - take ibuprofen 600mg  o 1700 - take acetaminophen 650mg  o Etc.  Continue taking this alternating pattern of ibuprofen and acetaminophen for 3 days  If you cannot take one or the other of these medications, just take the one you can every 6 hours.  If you are comfortable at night, you don't have to wake up and take a medication.  If you are still uncomfortable after taking either ibuprofen or acetaminophen, try gentle stretching exercise and ice packs (a bag of frozen vegetables works great).  If you are still uncomfortable, you may fill the narcotic prescription of Oxycodone and take as directed.  Once you have completed these prescriptions, your pain level should be low enough to stop taking medications altogether or just use an over the counter medication (ibuprofen or acetaminophen) as needed.   Pain Control: Over the Counter Medications to take as needed  Colace/Docusate: May be prescribed by your surgeon to prevent constipation caused by the combination of narcotics, effects of anesthesia, and decreased ambulation.  Hold for loose stools or diarrhea. Take 100 mg 1-2 times a day starting tonight.   Fiber: High fiber foods, extra liquids (water 9-13 cups/day) can also assist with constipation. Examples of high fiber foods are fruit, bran. Prune juice and water are also good liquids to drink.  Milk of Magnesia/Miralax:  If constipated despite take the over the counter stool softeners, you may take Milk of Magnesia or Miralax as directed on bottle to assist with constipation.     Pepcid/Famotidine: May be prescribed while taking naproxen (Aleve) or other NSAIDs such as ibuprofen  (Motrin/Advil) to prevent stomach upset or Acid-reflux symptoms. Take 1 tablet 1-2 times a day.   **Constipation: The first bowel movement may occur anywhere between 1-5 days after surgery.  As long as you are not nauseated or not having significant abdominal pain this variation is acceptable. Narcotic pain medications can cause constipation increasing discomfort; early discontinuation will assist with bowel management.If constipated despite taking stool softeners,  you may take Milk of Magnesia or Miralax as directed on the bottle.     **Home medications: You may restart your home medications as directed by your respective Primary Care Physician or Surgeon.   When to notify your Doctor or Healthcare Team  Sign of Wound Infection   Fever over 100 degrees.  Wound becomes extremely swollen, shows red streaks, warm to the touch, and/or drainage from the incision site or foul-smelling drainage.  Wound edges separate or opens up  Bleeding or bruising   If you have bleeding, apply pressure to the site and hold the pressure firmly for 5  minutes. If the bleeding continues, apply pressure again and call 911. If the bleeding stopped, call your doctor to report it.   Call your doctor or nurse if you have increased bleeding from your site and increased bruising or a lump forms or gets larger under your skin at the site.  Unrelieved Pain    Call your doctor or nurse if your pain gets worse or is not eased 1 hour after taking your pain medicine, or if it is severe and uncontrolled.  Fever, Flu-like symptoms   Fever over 100 degrees and/or chills  Gastrointestinal Symptoms    If you have yellowing of your eyes or skin (jaundice)  If you have dark or rust-colored urine  If your stool is gray colored   If you have nausea and vomiting that continues more than 24 hours, will not let you keep medicine down and will not let you keep fluids down  Black tarry bowel movements. This can be normal after surgery  on the stomach, but should resolve in a day or two.    Call 911 if you suddenly have signs of blood loss such as:  Vomiting blood  Fast heart rate  Feeling faint, sweaty, or blacking out  Passing bright red blood from your rectum  Blood Clot Symptoms   Tender, swollen or reddened areas in your calf muscle or thighs.  Numbness or tingling in your lower leg or calf, or at the top of your leg or groin  Skin on your leg looks pale or blue or feels cold to touch  Chest pain or have trouble breathing, lightheadedness, fast heart rate  Sudden Onset of Symptoms    Call 911 if you suddenly have:  Leg weakness and spasm  Loss of bladder or bowel function  Seizure  Confusion, severe headache, dizziness or feeling unsteady, problems talking, difficulty swallowing, and/or numbness or muscle weakness as these could be signs of a stroke.   Follow up Appointment Your follow up appointment should be scheduled 2-3 weeks after your surgery date.  If you have not previously scheduled for a follow-up visit you can be scheduled by contacting [phone]

## 2023-08-27 NOTE — H&P (Addendum)
     Nancy Wright 02-28-1976  [id number].    HPI:  48 y/o F with symptomatic cholelithiasis who presents for elective cholecystectomy. She reports that she is in her usual state of health and denies any recent changes in medication.  Exam was completed with an Arabic interpreter.   ROS: Review of Systems  Constitutional: Negative.   HENT: Negative.    Eyes: Negative.   Respiratory: Negative.    Cardiovascular: Negative.   Gastrointestinal: Negative.   Genitourinary: Negative.   Musculoskeletal: Negative.   Skin: Negative.   Neurological: Negative.   Endo/Heme/Allergies: Negative.   Psychiatric/Behavioral: Negative.      Family History  Problem Relation Age of Onset   Diabetes Father    Hypertension Father     Past Medical History:  Diagnosis Date   IDA (iron  deficiency anemia) 11/16/2021    Past Surgical History:  Procedure Laterality Date   CESAREAN SECTION     x 1    Social History:  reports that she has never smoked. She has never used smokeless tobacco. She reports that she does not drink alcohol and does not use drugs.  Allergies: No Known Allergies  Medications Prior to Admission  Medication Sig Dispense Refill   ibuprofen  (ADVIL ) 200 MG tablet Take 200 mg by mouth every 8 (eight) hours as needed (pain.).      Physical Exam: Blood pressure 133/79, pulse 78, temperature 97.9 F (36.6 C), temperature source Oral, resp. rate 18, height 5\' 2"  (1.575 m), weight 63 kg, last menstrual period 08/24/2023, SpO2 100%. Gen: female, NAD Abd: soft, non-distended  Results for orders placed or performed during the hospital encounter of 08/27/23 (from the past 48 hours)  CBC     Status: Abnormal   Collection Time: 08/27/23  5:55 AM  Result Value Ref Range   WBC 6.5 4.0 - 10.5 K/uL   RBC 4.96 3.87 - 5.11 MIL/uL   Hemoglobin 12.0 12.0 - 15.0 g/dL   HCT 10.2 72.5 - 36.6 %   MCV 77.0 (L) 80.0 - 100.0 fL   MCH 24.2 (L) 26.0 - 34.0 pg   MCHC 31.4 30.0 - 36.0 g/dL    RDW 44.0 34.7 - 42.5 %   Platelets 315 150 - 400 K/uL   nRBC 0.0 0.0 - 0.2 %    Comment: Performed at [facility], [address] [place], [place] [address]  Pregnancy, urine POC     Status: None   Collection Time: 08/27/23  6:33 AM  Result Value Ref Range   Preg Test, Ur NEGATIVE NEGATIVE    Comment:        THE SENSITIVITY OF THIS METHODOLOGY IS >24 mIU/mL    No results found.  Assessment/Plan 48 y/o F w/ symptomatic cholelithiasis   - Will proceed to the OR. We discussed the alternatives and potential risks of surgery, including but not limited to: bleeding, infection, damage to bowel or surrounding structures, bile leak, damage to the biliary system, pancreatitis, retained stone, and need for additional procedures. All questions were addressed and consent was obtained.     Trula Gable Surgery 08/27/2023, 7:02 AM Please see Amion for pager number during day hours 7:00am-4:30pm or 7:00am -11:30am on weekends

## 2023-08-27 NOTE — Anesthesia Procedure Notes (Signed)
 Procedure Name: Intubation Date/Time: 08/27/2023 7:37 AM  Performed by: Hebert Littler, CRNAPre-anesthesia Checklist: Patient identified, Emergency Drugs available, Suction available and Patient being monitored Patient Re-evaluated:Patient Re-evaluated prior to induction Oxygen Delivery Method: Circle System Utilized Preoxygenation: Pre-oxygenation with 100% oxygen Induction Type: IV induction Ventilation: Mask ventilation without difficulty Laryngoscope Size: Mac and 3 Grade View: Grade I Tube type: Oral Number of attempts: 1 Airway Equipment and Method: Stylet and Oral airway Placement Confirmation: ETT inserted through vocal cords under direct vision, positive ETCO2 and breath sounds checked- equal and bilateral Secured at: 21 cm Tube secured with: Tape Dental Injury: Teeth and Oropharynx as per pre-operative assessment

## 2023-08-27 NOTE — Anesthesia Preprocedure Evaluation (Signed)
 Anesthesia Evaluation  Patient identified by MRN, date of birth, ID band Patient awake    Reviewed: Allergy & Precautions, NPO status , Patient's Chart, lab work & pertinent test results, reviewed documented beta blocker date and time   History of Anesthesia Complications Negative for: history of anesthetic complications  Airway Mallampati: III  TM Distance: >3 FB     Dental  (+) Poor Dentition   Pulmonary neg COPD, neg PE   breath sounds clear to auscultation       Cardiovascular (-) hypertension(-) CAD, (-) Past MI and (-) CABG  Rhythm:Regular Rate:Normal     Neuro/Psych neg Seizures    GI/Hepatic ,neg GERD  ,,(+) neg Cirrhosis      cholecystitis   Endo/Other    Renal/GU Renal disease     Musculoskeletal   Abdominal   Peds  Hematology  (+) Blood dyscrasia, anemia   Anesthesia Other Findings   Reproductive/Obstetrics                              Anesthesia Physical Anesthesia Plan  ASA: 2  Anesthesia Plan: General   Post-op Pain Management:    Induction: Intravenous  PONV Risk Score and Plan: 2 and Ondansetron  and Dexamethasone  Airway Management Planned: Oral ETT  Additional Equipment:   Intra-op Plan:   Post-operative Plan: Extubation in OR  Informed Consent: I have reviewed the patients History and Physical, chart, labs and discussed the procedure including the risks, benefits and alternatives for the proposed anesthesia with the patient or authorized representative who has indicated his/her understanding and acceptance.     Dental advisory given  Plan Discussed with: CRNA  Anesthesia Plan Comments:          Anesthesia Quick Evaluation

## 2023-08-28 ENCOUNTER — Other Ambulatory Visit (HOSPITAL_BASED_OUTPATIENT_CLINIC_OR_DEPARTMENT_OTHER): Payer: Self-pay

## 2023-08-28 ENCOUNTER — Encounter (HOSPITAL_COMMUNITY): Payer: Self-pay | Admitting: General Surgery

## 2023-08-29 LAB — SURGICAL PATHOLOGY
